# Patient Record
Sex: Female | Born: 1935 | Race: White | Hispanic: No
Health system: Southern US, Community
[De-identification: ages and names within clinical notes are randomized; demographics above are authoritative.]

---

## 2002-07-28 ENCOUNTER — Inpatient Hospital Stay (HOSPITAL_COMMUNITY): Admission: AD | Admit: 2002-07-28 | Discharge: 2002-08-01 | Payer: Self-pay | Admitting: Orthopedic Surgery

## 2002-07-28 ENCOUNTER — Encounter: Payer: Self-pay | Admitting: Orthopedic Surgery

## 2002-07-29 ENCOUNTER — Encounter: Payer: Self-pay | Admitting: Orthopedic Surgery

## 2002-08-01 ENCOUNTER — Inpatient Hospital Stay (HOSPITAL_COMMUNITY)
Admission: RE | Admit: 2002-08-01 | Discharge: 2002-08-07 | Payer: Self-pay | Admitting: Physical Medicine & Rehabilitation

## 2003-03-29 ENCOUNTER — Encounter: Payer: Self-pay | Admitting: Emergency Medicine

## 2003-03-29 ENCOUNTER — Inpatient Hospital Stay (HOSPITAL_COMMUNITY): Admission: EM | Admit: 2003-03-29 | Discharge: 2003-04-06 | Payer: Self-pay | Admitting: Emergency Medicine

## 2003-03-30 ENCOUNTER — Encounter: Payer: Self-pay | Admitting: Internal Medicine

## 2004-12-20 ENCOUNTER — Inpatient Hospital Stay (HOSPITAL_COMMUNITY): Admission: EM | Admit: 2004-12-20 | Discharge: 2004-12-26 | Payer: Self-pay | Admitting: Emergency Medicine

## 2004-12-20 ENCOUNTER — Ambulatory Visit: Payer: Self-pay | Admitting: Physical Medicine & Rehabilitation

## 2004-12-26 ENCOUNTER — Inpatient Hospital Stay
Admission: RE | Admit: 2004-12-26 | Discharge: 2005-01-02 | Payer: Self-pay | Admitting: Physical Medicine & Rehabilitation

## 2005-11-11 ENCOUNTER — Inpatient Hospital Stay (HOSPITAL_COMMUNITY): Admission: EM | Admit: 2005-11-11 | Discharge: 2005-11-15 | Payer: Self-pay | Admitting: Emergency Medicine

## 2008-07-24 ENCOUNTER — Emergency Department (HOSPITAL_COMMUNITY): Admission: EM | Admit: 2008-07-24 | Discharge: 2008-07-25 | Payer: Self-pay | Admitting: Emergency Medicine

## 2009-08-08 ENCOUNTER — Inpatient Hospital Stay (HOSPITAL_COMMUNITY): Admission: EM | Admit: 2009-08-08 | Discharge: 2009-08-12 | Payer: Self-pay | Admitting: Emergency Medicine

## 2009-09-20 ENCOUNTER — Inpatient Hospital Stay (HOSPITAL_COMMUNITY): Admission: AD | Admit: 2009-09-20 | Discharge: 2009-09-23 | Payer: Self-pay | Admitting: Geriatric Medicine

## 2010-05-18 ENCOUNTER — Inpatient Hospital Stay (HOSPITAL_COMMUNITY): Admission: EM | Admit: 2010-05-18 | Discharge: 2010-05-26 | Payer: Self-pay | Admitting: Internal Medicine

## 2010-05-18 ENCOUNTER — Ambulatory Visit: Payer: Self-pay | Admitting: Diagnostic Radiology

## 2010-05-18 ENCOUNTER — Encounter: Payer: Self-pay | Admitting: Emergency Medicine

## 2010-09-17 ENCOUNTER — Encounter: Payer: Self-pay | Admitting: Geriatric Medicine

## 2010-11-09 LAB — URINE CULTURE
Colony Count: NO GROWTH
Colony Count: >=100000

## 2010-11-09 LAB — COMPREHENSIVE METABOLIC PANEL
ALT: 36 U/L — ABNORMAL HIGH (ref 0–35)
AST: 33 U/L (ref 0–37)
AST: 39 U/L — ABNORMAL HIGH (ref 0–37)
Albumin: 3.1 g/dL — ABNORMAL LOW (ref 3.5–5.2)
Albumin: 3.1 g/dL — ABNORMAL LOW (ref 3.5–5.2)
Albumin: 3.9 g/dL (ref 3.5–5.2)
Alkaline Phosphatase: 85 U/L (ref 39–117)
Alkaline Phosphatase: 129 U/L — ABNORMAL HIGH (ref 39–117)
BUN: 19 mg/dL (ref 6–23)
CO2: 22 mEq/L (ref 19–32)
Calcium: 8.4 mg/dL (ref 8.4–10.5)
Chloride: 113 mEq/L — ABNORMAL HIGH (ref 96–112)
Chloride: 115 mEq/L — ABNORMAL HIGH (ref 96–112)
Creatinine, Ser: 0.9 mg/dL (ref 0.4–1.2)
GFR calc Af Amer: 36 mL/min — ABNORMAL LOW (ref >60)
GFR calc Af Amer: 58 mL/min — ABNORMAL LOW (ref >60)
GFR calc non Af Amer: 30 mL/min — ABNORMAL LOW (ref >60)
Potassium: 3.3 mEq/L — ABNORMAL LOW (ref 3.5–5.1)
Potassium: 3 mEq/L — ABNORMAL LOW (ref 3.5–5.1)
Sodium: 143 mEq/L (ref 135–145)
Sodium: 145 mEq/L (ref 135–145)
Total Bilirubin: 0.6 mg/dL (ref 0.3–1.2)
Total Protein: 6.5 g/dL (ref 6.0–8.3)
Total Protein: 7.4 g/dL (ref 6.0–8.3)

## 2010-11-09 LAB — BASIC METABOLIC PANEL
BUN: 6 mg/dL (ref 6–23)
BUN: 6 mg/dL (ref 6–23)
BUN: 7 mg/dL (ref 6–23)
BUN: 9 mg/dL (ref 6–23)
BUN: 9 mg/dL (ref 6–23)
CO2: 25 mEq/L (ref 19–32)
Calcium: 8.1 mg/dL — ABNORMAL LOW (ref 8.4–10.5)
Calcium: 8.5 mg/dL (ref 8.4–10.5)
Calcium: 9 mg/dL (ref 8.4–10.5)
Calcium: 9 mg/dL (ref 8.4–10.5)
Calcium: 9.3 mg/dL (ref 8.4–10.5)
Creatinine, Ser: 0.65 mg/dL (ref 0.4–1.2)
Creatinine, Ser: 0.72 mg/dL (ref 0.4–1.2)
Creatinine, Ser: 1.27 mg/dL — ABNORMAL HIGH (ref 0.4–1.2)
GFR calc Af Amer: >60 mL/min (ref >60)
GFR calc non Af Amer: >60 mL/min (ref >60)
GFR calc non Af Amer: >60 mL/min (ref >60)
GFR calc non Af Amer: >60 mL/min (ref >60)
GFR calc non Af Amer: >60 mL/min (ref >60)
GFR calc non Af Amer: >60 mL/min (ref >60)
Glucose, Bld: 117 mg/dL — ABNORMAL HIGH (ref 70–99)
Glucose, Bld: 117 mg/dL — ABNORMAL HIGH (ref 70–99)
Glucose, Bld: 151 mg/dL — ABNORMAL HIGH (ref 70–99)
Glucose, Bld: 95 mg/dL (ref 70–99)
Glucose, Bld: 98 mg/dL (ref 70–99)
Potassium: 2.7 mEq/L — CL (ref 3.5–5.1)
Potassium: 3 mEq/L — ABNORMAL LOW (ref 3.5–5.1)
Potassium: 3.4 mEq/L — ABNORMAL LOW (ref 3.5–5.1)

## 2010-11-09 LAB — CARDIAC PANEL(CRET KIN+CKTOT+MB+TROPI)
CK, MB: 3 ng/mL (ref 0.3–4.0)
Relative Index: INVALID (ref 0.0–2.5)
Relative Index: INVALID (ref 0.0–2.5)
Relative Index: INVALID (ref 0.0–2.5)
Total CK: 43 U/L (ref 7–177)
Troponin I: 0.33 ng/mL — ABNORMAL HIGH (ref 0.00–0.06)

## 2010-11-09 LAB — POCT CARDIAC MARKERS
CKMB, poc: <1 ng/mL — ABNORMAL LOW (ref 1.0–8.0)
Myoglobin, poc: 57.9 ng/mL (ref 12–200)
Troponin i, poc: <0.05 ng/mL (ref 0.00–0.09)

## 2010-11-09 LAB — CBC
HCT: 32.8 % — ABNORMAL LOW (ref 36.0–46.0)
HCT: 34.5 % — ABNORMAL LOW (ref 36.0–46.0)
HCT: 36.4 % (ref 36.0–46.0)
Hemoglobin: 11.1 g/dL — ABNORMAL LOW (ref 12.0–15.0)
Hemoglobin: 11.1 g/dL — ABNORMAL LOW (ref 12.0–15.0)
Hemoglobin: 11.2 g/dL — ABNORMAL LOW (ref 12.0–15.0)
MCH: 29.4 pg (ref 26.0–34.0)
MCHC: 33.7 g/dL (ref 30.0–36.0)
MCHC: 33.8 g/dL (ref 30.0–36.0)
MCHC: 33.9 g/dL (ref 30.0–36.0)
MCHC: 34 g/dL (ref 30.0–36.0)
MCHC: 34.2 g/dL (ref 30.0–36.0)
MCHC: 34.2 g/dL (ref 30.0–36.0)
MCV: 87.1 fL (ref 78.0–100.0)
MCV: 87.3 fL (ref 78.0–100.0)
MCV: 87.5 fL (ref 78.0–100.0)
MCV: 86.1 fL (ref 78.0–100.0)
Platelets: 225 10*3/uL (ref 150–400)
Platelets: 264 10*3/uL (ref 150–400)
Platelets: 274 10*3/uL (ref 150–400)
Platelets: 285 10*3/uL (ref 150–400)
Platelets: 304 10*3/uL (ref 150–400)
Platelets: 328 10*3/uL (ref 150–400)
Platelets: 289 10*3/uL (ref 150–400)
RBC: 3.75 MIL/uL — ABNORMAL LOW (ref 3.87–5.11)
RBC: 3.78 MIL/uL — ABNORMAL LOW (ref 3.87–5.11)
RBC: 3.98 MIL/uL (ref 3.87–5.11)
RBC: 4.05 MIL/uL (ref 3.87–5.11)
RDW: 14.7 % (ref 11.5–15.5)
RDW: 14.9 % (ref 11.5–15.5)
RDW: 15 % (ref 11.5–15.5)
RDW: 15.2 % (ref 11.5–15.5)
RDW: 15.2 % (ref 11.5–15.5)
RDW: 15.3 % (ref 11.5–15.5)
RDW: 15.5 % (ref 11.5–15.5)
RDW: 14.2 % (ref 11.5–15.5)
WBC: 15.3 10*3/uL — ABNORMAL HIGH (ref 4.0–10.5)
WBC: 15.9 10*3/uL — ABNORMAL HIGH (ref 4.0–10.5)
WBC: 32.1 10*3/uL — ABNORMAL HIGH (ref 4.0–10.5)
WBC: 29.4 10*3/uL — ABNORMAL HIGH (ref 4.0–10.5)

## 2010-11-09 LAB — CULTURE, BLOOD (ROUTINE X 2)
Culture  Setup Time: 201109230219
Culture  Setup Time: 201109230220
Culture  Setup Time: 201109251438
Culture: NO GROWTH
Culture: NO GROWTH
Culture: NO GROWTH
Culture: NO GROWTH

## 2010-11-09 LAB — URINALYSIS, MICROSCOPIC ONLY
Bilirubin Urine: NEGATIVE
Nitrite: NEGATIVE
Specific Gravity, Urine: 1.017 (ref 1.005–1.030)
Urobilinogen, UA: 0.2 mg/dL (ref 0.0–1.0)
pH: 6 (ref 5.0–8.0)

## 2010-11-09 LAB — URINALYSIS, ROUTINE W REFLEX MICROSCOPIC
Nitrite: POSITIVE — AB
Protein, ur: >300 mg/dL — AB
Urobilinogen, UA: 1 mg/dL (ref 0.0–1.0)

## 2010-11-09 LAB — POTASSIUM: Potassium: 4.1 mEq/L (ref 3.5–5.1)

## 2010-11-09 LAB — DIFFERENTIAL
Basophils Absolute: 0 10*3/uL (ref 0.0–0.1)
Basophils Relative: 0 % (ref 0–1)
Basophils Relative: 0 % (ref 0–1)
Eosinophils Relative: 4 % (ref 0–5)
Eosinophils Relative: 1 % (ref 0–5)
Lymphocytes Relative: 6 % — ABNORMAL LOW (ref 12–46)
Monocytes Absolute: 1.6 10*3/uL — ABNORMAL HIGH (ref 0.1–1.0)
Monocytes Relative: 3 % (ref 3–12)
Neutro Abs: 8.8 10*3/uL — ABNORMAL HIGH (ref 1.7–7.7)
Neutro Abs: 26.4 10*3/uL — ABNORMAL HIGH (ref 1.7–7.7)

## 2010-11-09 LAB — MAGNESIUM
Magnesium: 1.6 mg/dL (ref 1.5–2.5)
Magnesium: 1.6 mg/dL (ref 1.5–2.5)
Magnesium: 2 mg/dL (ref 1.5–2.5)

## 2010-11-09 LAB — TROPONIN I
Troponin I: 0.4 ng/mL — ABNORMAL HIGH (ref 0.00–0.06)
Troponin I: 0.42 ng/mL — ABNORMAL HIGH (ref 0.00–0.06)

## 2010-11-09 LAB — MRSA PCR SCREENING: MRSA by PCR: NEGATIVE

## 2010-11-09 LAB — URINE MICROSCOPIC-ADD ON

## 2010-11-09 LAB — CLOSTRIDIUM DIFFICILE BY PCR: Toxigenic C. Difficile by PCR: NOT DETECTED

## 2010-11-12 LAB — BASIC METABOLIC PANEL
BUN: 17 mg/dL (ref 6–23)
BUN: 26 mg/dL — ABNORMAL HIGH (ref 6–23)
BUN: 37 mg/dL — ABNORMAL HIGH (ref 6–23)
CO2: 26 mEq/L (ref 19–32)
Chloride: 102 mEq/L (ref 96–112)
Chloride: 99 mEq/L (ref 96–112)
Creatinine, Ser: 1.53 mg/dL — ABNORMAL HIGH (ref 0.4–1.2)
GFR calc Af Amer: 38 mL/min — ABNORMAL LOW (ref >60)
GFR calc non Af Amer: 42 mL/min — ABNORMAL LOW (ref >60)
Glucose, Bld: 101 mg/dL — ABNORMAL HIGH (ref 70–99)
Glucose, Bld: 105 mg/dL — ABNORMAL HIGH (ref 70–99)
Potassium: 2.8 mEq/L — ABNORMAL LOW (ref 3.5–5.1)
Potassium: 3.2 mEq/L — ABNORMAL LOW (ref 3.5–5.1)
Potassium: 3.8 mEq/L (ref 3.5–5.1)
Sodium: 134 mEq/L — ABNORMAL LOW (ref 135–145)
Sodium: 137 mEq/L (ref 135–145)

## 2010-11-12 LAB — COMPREHENSIVE METABOLIC PANEL
AST: 22 U/L (ref 0–37)
Albumin: 3.1 g/dL — ABNORMAL LOW (ref 3.5–5.2)
BUN: 50 mg/dL — ABNORMAL HIGH (ref 6–23)
Calcium: 8.6 mg/dL (ref 8.4–10.5)
Chloride: 97 mEq/L (ref 96–112)
Creatinine, Ser: 3.02 mg/dL — ABNORMAL HIGH (ref 0.4–1.2)
GFR calc Af Amer: 17 mL/min — ABNORMAL LOW (ref >60)
Total Protein: 5.7 g/dL — ABNORMAL LOW (ref 6.0–8.3)

## 2010-11-12 LAB — URINALYSIS, MICROSCOPIC ONLY
Nitrite: NEGATIVE
Protein, ur: NEGATIVE mg/dL
Specific Gravity, Urine: 1.014 (ref 1.005–1.030)
Urobilinogen, UA: 0.2 mg/dL (ref 0.0–1.0)

## 2010-11-12 LAB — AMYLASE: Amylase: 130 U/L — ABNORMAL HIGH (ref 0–105)

## 2010-11-12 LAB — DIFFERENTIAL
Basophils Absolute: 0.1 10*3/uL (ref 0.0–0.1)
Basophils Relative: 0 % (ref 0–1)
Eosinophils Relative: 1 % (ref 0–5)
Lymphocytes Relative: 18 % (ref 12–46)
Neutro Abs: 9.5 10*3/uL — ABNORMAL HIGH (ref 1.7–7.7)

## 2010-11-12 LAB — CBC
HCT: 35.6 % — ABNORMAL LOW (ref 36.0–46.0)
HCT: 36.2 % (ref 36.0–46.0)
Hemoglobin: 11.8 g/dL — ABNORMAL LOW (ref 12.0–15.0)
MCHC: 33.2 g/dL (ref 30.0–36.0)
MCHC: 34.4 g/dL (ref 30.0–36.0)
MCV: 87.1 fL (ref 78.0–100.0)
Platelets: 243 10*3/uL (ref 150–400)
Platelets: 301 10*3/uL (ref 150–400)
RDW: 13.9 % (ref 11.5–15.5)
RDW: 13.9 % (ref 11.5–15.5)
RDW: 14.1 % (ref 11.5–15.5)
WBC: 11 10*3/uL — ABNORMAL HIGH (ref 4.0–10.5)

## 2010-11-12 LAB — LIPASE, BLOOD: Lipase: 38 U/L (ref 11–59)

## 2010-11-12 LAB — HEMOGLOBIN AND HEMATOCRIT, BLOOD: Hemoglobin: 13.1 g/dL (ref 12.0–15.0)

## 2010-11-12 LAB — CARDIAC PANEL(CRET KIN+CKTOT+MB+TROPI)
CK, MB: 1.2 ng/mL (ref 0.3–4.0)
Total CK: 19 U/L (ref 7–177)

## 2010-11-12 LAB — URINE CULTURE: Culture: NO GROWTH

## 2010-11-28 LAB — DIFFERENTIAL
Basophils Absolute: 0.2 10*3/uL — ABNORMAL HIGH (ref 0.0–0.1)
Eosinophils Absolute: 0.3 10*3/uL (ref 0.0–0.7)
Lymphocytes Relative: 11 % — ABNORMAL LOW (ref 12–46)
Lymphocytes Relative: 17 % (ref 12–46)
Lymphs Abs: 1.5 10*3/uL (ref 0.7–4.0)
Lymphs Abs: 1.9 10*3/uL (ref 0.7–4.0)
Lymphs Abs: 2.1 10*3/uL (ref 0.7–4.0)
Monocytes Absolute: 1 10*3/uL (ref 0.1–1.0)
Monocytes Relative: 10 % (ref 3–12)
Monocytes Relative: 8 % (ref 3–12)
Neutro Abs: 6.3 10*3/uL (ref 1.7–7.7)
Neutro Abs: 8 10*3/uL — ABNORMAL HIGH (ref 1.7–7.7)
Neutrophils Relative %: 63 % (ref 43–77)
Neutrophils Relative %: 72 % (ref 43–77)
Neutrophils Relative %: 81 % — ABNORMAL HIGH (ref 43–77)

## 2010-11-28 LAB — BASIC METABOLIC PANEL
BUN: 15 mg/dL (ref 6–23)
BUN: 25 mg/dL — ABNORMAL HIGH (ref 6–23)
CO2: 28 mEq/L (ref 19–32)
Calcium: 8.2 mg/dL — ABNORMAL LOW (ref 8.4–10.5)
Calcium: 8.8 mg/dL (ref 8.4–10.5)
Calcium: 9.3 mg/dL (ref 8.4–10.5)
Chloride: 102 mEq/L (ref 96–112)
Chloride: 106 mEq/L (ref 96–112)
Creatinine, Ser: 0.91 mg/dL (ref 0.4–1.2)
Creatinine, Ser: 0.98 mg/dL (ref 0.4–1.2)
Creatinine, Ser: 1.19 mg/dL (ref 0.4–1.2)
GFR calc Af Amer: >60 mL/min (ref >60)
GFR calc Af Amer: >60 mL/min (ref >60)
GFR calc non Af Amer: 42 mL/min — ABNORMAL LOW (ref >60)
GFR calc non Af Amer: 57 mL/min — ABNORMAL LOW (ref >60)
Potassium: 3.5 mEq/L (ref 3.5–5.1)
Sodium: 141 mEq/L (ref 135–145)

## 2010-11-28 LAB — URINE CULTURE
Colony Count: NO GROWTH
Culture: NO GROWTH

## 2010-11-28 LAB — CBC
Hemoglobin: 11.4 g/dL — ABNORMAL LOW (ref 12.0–15.0)
MCHC: 33.4 g/dL (ref 30.0–36.0)
Platelets: 258 10*3/uL (ref 150–400)
Platelets: 295 10*3/uL (ref 150–400)
RBC: 3.82 MIL/uL — ABNORMAL LOW (ref 3.87–5.11)
RBC: 3.89 MIL/uL (ref 3.87–5.11)
WBC: 11.1 10*3/uL — ABNORMAL HIGH (ref 4.0–10.5)
WBC: 13.7 10*3/uL — ABNORMAL HIGH (ref 4.0–10.5)
WBC: 9.9 10*3/uL (ref 4.0–10.5)

## 2010-11-28 LAB — URINE MICROSCOPIC-ADD ON

## 2010-11-28 LAB — URINALYSIS, ROUTINE W REFLEX MICROSCOPIC
Glucose, UA: NEGATIVE mg/dL
Leukocytes, UA: NEGATIVE
Nitrite: NEGATIVE
Protein, ur: 30 mg/dL — AB
pH: 6 (ref 5.0–8.0)

## 2010-11-28 LAB — RETICULOCYTES: Retic Count, Absolute: 49.8 10*3/uL (ref 19.0–186.0)

## 2010-11-28 LAB — IRON AND TIBC
Saturation Ratios: 7 % — ABNORMAL LOW (ref 20–55)
TIBC: 228 ug/dL — ABNORMAL LOW (ref 250–470)

## 2010-11-28 LAB — VITAMIN B12: Vitamin B-12: 422 pg/mL (ref 211–911)

## 2010-11-28 LAB — FOLATE: Folate: 16.2 ng/mL

## 2010-11-28 LAB — HEMOCCULT GUIAC POC 1CARD (OFFICE): Fecal Occult Bld: NEGATIVE

## 2010-11-28 LAB — MAGNESIUM: Magnesium: 1.8 mg/dL (ref 1.5–2.5)

## 2010-11-28 LAB — PROTIME-INR
INR: 1.63 — ABNORMAL HIGH (ref 0.00–1.49)
Prothrombin Time: 19.2 seconds — ABNORMAL HIGH (ref 11.6–15.2)

## 2011-01-12 NOTE — Discharge Summary (Signed)
NAME:  [name], [name]              ACCOUNT NO.:  [id number]   MEDICAL RECORD NO.:  [id number]          PATIENT TYPE:  INP   LOCATION:  5526                         FACILITY:  [facility]   PHYSICIAN:  Deirdre Peer. Polite, M.D. DATE OF BIRTH:  10-Feb-1908   DATE OF ADMISSION:  11/10/2005  DATE OF DISCHARGE:                                 DISCHARGE SUMMARY   DISCHARGE DIAGNOSES:  1.  Urinary tract infection, culture positive for Klebsiella pneumoniae. To      complete 3 more days of antibiotics.  2.  Renal insufficiency, resolved, more than likely secondary to diuretic      plus previous diarrheal illness.  3.  Chronic atrial fibrillation. Please note INR elevated on admission at      6.6. Coumadin held. INR at discharge 1.4. Will allow to slowly rise into      normal range. Further outpatient followup by primary physician.  4.  Elevated INR, resolved.  5.  Mild compression fracture at L1 per family. No invasive evaluation at      this time. The patient and family are aware and cleared for further      outpatient followup as needed.   DISCHARGE MEDICATIONS:  1.  Cipro 250 one pill b.i.d. for 3 more days.  2.  Cardizem 40 one daily.  3.  Coumadin 1 mg daily.  4.  The patient was asked to hold Lasix until seen by primary M.D.  5.  P.r.n. Tylenol for low back pain.   DISPOSITION:  The patient will be discharged to an assisted living facility.   STUDIES:  Urine culture:  Klebsiella pneumoniae. INR at discharge 1.4. Blood  cultures negative. Stool for C. difficile negative. Creatinine at discharge  1.3. CBC March 20:  White count 13.7 down from 17.2; otherwise, within  normal limits. CT of the head:  Atrophy with chronic microvascular  __________.  No definite stroke or hemorrhage. X-ray of the pelvis:  Status  post __________ hip surgery. __________. X-ray of the L spine:  Mild L1  compression fracture __________.   HISTORY OF PRESENT ILLNESS:  __________ azotemia, as well as  leukocytosis.  In the ED the patient was evaluated, felt to have a UTI and was also found  to have Coumadin mild toxicity. Admission was deemed necessary for further  evaluation and treatment. Please see dictated H&P for further details.   PAST MEDICAL HISTORY:  Per admission H&P.   FAMILY AND SOCIAL HISTORY:  Per admission H&P.   MEDICATIONS:  Per admission H&P.   HOSPITAL COURSE:  The patient was admitted to a telemetry floor bed for  evaluation of __________. Ultimately, it was determined that the patient's  leukocytosis was secondary to a UTI. Urine culture did grow out Klebsiella  pneumoniae sensitive to quinolone. The patient is to continue 3 more days of  antibiotics. As for the patient's azotemia, it is multifactorial secondary  to __________ with diuretics as well as a previous diarrheal illness. Please  note the patient's creatinine at discharge was 1.3.  Please note also that  the patient had problems with low back pain  during this admission. Had an x-  ray that showed a mild compression fracture which was fairly well controlled  with Tylenol. The patient is very resistant to taking narcotic medication.  The case was discussed with her and her son in detail and will continue  conservative treatment at this time. As for the patient's Coumadin toxicity,  probably secondary to acute illness. Recommend increased frequency and  monitoring of INR. Please note INR at discharge is 1.4. We will allow the  INR to slowly drift into a normal range. Recommend further followup by  primary M.D. The patient has been evaluated by PT/OT. Discharge plan has  been discussed with the family in detail, and per the family to be  discharged back to an assisted living facility. It was discussed with the  family in detail and all arrangements have been arranged. The patient is  ready for discharge today.      Deirdre Peer. Polite, M.D.  Electronically Signed     RDP/MEDQ  D:  11/15/2005  T:   11/15/2005  Job:  [id number]   cc:   Hal T. Stoneking, M.D.  Fax: [phone]

## 2011-01-12 NOTE — H&P (Signed)
NAME:  [name], [name]                          ACCOUNT NO.:  [id number]   MEDICAL RECORD NO.:  [id number]                   PATIENT TYPE:   LOCATION:                                       FACILITY:   PHYSICIAN:  [name], M.D.                 DATE OF BIRTH:  Jan 22, 1908   DATE OF ADMISSION:  07/28/2002  DATE OF DISCHARGE:                                HISTORY & PHYSICAL   CHIEF COMPLAINT:  Right hip pain.   HISTORY OF PRESENT ILLNESS:  The patient is a 77 year old white female who  reported to have fallen at home yesterday, landing on her right side.  She  complains of pain in the right hip and difficulty with ambulation.  She went  to her primary care physician, [name], M.D., where x-rays were  taken and she was found to have a nondisplaced subcapital femoral neck  fracture.  The patient was referred to our office for further evaluation.  [name], M.D., feels that this needs surgical pinning.  We will admit  her for this procedure once she is medically cleared and her Coumadin is  normalized.   ALLERGIES:  No known drug allergies.   CURRENT MEDICATIONS:  1. Coumadin 2 mg p.o. q.d.  2. Antivert 12.5 mg p.o. q.a.m. and q.h.s.  3. Hydrochlorothiazide 25 mg p.o. at noon.  4. Captopril 25 mg p.o. b.i.d.  5. Chlorazepate 3.25 mg p.o. p.r.n.  6. Tiazac 120 mg p.o. q.d.   PAST MEDICAL HISTORY:  1. Labyrinthitis.  2. Hypertension.   PAST SURGICAL HISTORY:  Cholecystectomy in 1970.   SOCIAL HISTORY:  The patient is a widow.  She does not smoke.  She does not  drink.   FAMILY HISTORY:  She does have four children.  Her daughter has problems  with CHF.  A son has a history of polio.  A second son has hypertension and  diabetes.  A third son has hypertension and diabetes.  I believe that her  son is a Orthoptist at [employer]. [employer]  Her father is  deceased from age-related issues at 19.  Her mother is deceased at 33.  She  had a stroke.  One  brother deceased at 15 and had Parkinson's.  One sister  deceased at 61 with pancreatic disease.  One sister is 105 with some heart  disease.  A sister in her 35s with Parkinson's.  Another sister with  hypertension and heart disease.  One brother in fairly good health.  One  brother with hypertension.  Another sister with hypertension.  One last  brother with hypertension.   REVIEW OF SYMPTOMS:  Noncontributory at this time.   PHYSICAL EXAMINATION:  GENERAL APPEARANCE:  This is an age-appropriate,  healthy-appearing, white female.  She is conscious, alert, and appropriate.  She is in a wheelchair.  She has difficulty with pain in her  right hip with  any type of ambulation.  HEENT:  Normocephalic and atraumatic.  Pupils equal, round, reactive, and  accommodating to light.  Extraocular movements intact.  The sclerae are not  icteric.  The conjunctivae are pink and moist.  External ears without  deformities.  Hearing is intact.  The oral buccal mucosa was intact.  The  patient was able to swallow without any problems.  NECK:  Supple.  No tenderness around the thyroid region.  She had fairly  good range of motion of her cervical spine.  No lymphadenopathy.  No carotid  bruits.  CHEST:  Clear and equal bilaterally.  HEART:  Irregularly irregular rate and rhythm of S1 and S2.  No murmurs,  rubs, or gallops noted.  ABDOMEN:  Soft and nontender.  Bowel sounds were normoactive throughout.  No  mass was palpable.  The CVA area was nontender.  EXTREMITIES:  The upper extremities were normal appearing.  In the lower  extremities, the patient had some discomfort with her right hip with any  type of range of motion.  The left hip had normal range of motion without  any difficulty.  The knees were symmetrical with no signs of infection or  effusions.  They were round and boggy appearing.  They had good range of  motion.  The ankles were symmetrical with good dorsiflexion and plantar  flexion.   PERIPHERAL VASCULATURE:  Carotid pulses with no bruits.  Radial pulses were  symmetrical and irregular.  Trace dorsalis pedis pulses.  The patient did  have 2+ pitting edema, but no venous stasis changes or pigmentation changes.  NEUROLOGIC:  The patient was conscious, alert, and appropriate.  Easy  conversation with the examiner.  She is grossly intact to light touch  sensation from head to toe.  There were no focal neurologic changes.  BREASTS:  Deferred.  RECTAL:  Deferred.  GENITOURINARY:  Deferred.   IMPRESSION:  1. Right hip subcapital femoral neck fracture, nondisplaced.  2. Hypertension.  3. Labyrinthitis.  4. Chronic atrial fibrillation on Coumadin.   PLAN:  The patient will be admitted to [facility] under  the care of [name], M.D.  The patient will have a stat PT and INR for  evaluation of her coagulation times and normalization as [name],  M.D., feels it is quite safe to normalize it with vitamin K.  All other labs  and tests will be performed prior to having an angulated pinning of her  right hip fracture.  The patient will also be evaluated by Dr. Pete Le in  the hospital this evening.     [name], [name], M.D.    RWK/MEDQ  D:  07/28/2002  T:  07/28/2002  Job:  [id number]

## 2011-01-12 NOTE — Discharge Summary (Signed)
NAME:  ALOHA, [name]              ACCOUNT NO.:  [id number]   MEDICAL RECORD NO.:  [id number]          PATIENT TYPE:  INP   LOCATION:  5024                         FACILITY:  [facility]   PHYSICIAN:  Jackie Plum, M.D.DATE OF BIRTH:  04/10/1908   DATE OF ADMISSION:  12/20/2004  DATE OF DISCHARGE:  12/25/2004                                 DISCHARGE SUMMARY   DISCHARGE DIAGNOSES:  1.  Thoracic compression fracture.  2.  History of chronic atrial fibrillation on chronic anticoagulation.  3.  History of hypertension.  4.  Benign positional vertigo.  5.  Osteoporosis and degenerative joint disease, status post right hip      replacement in December 2003; repair of left hip fracture in 2004.   MEDICATIONS:  1.  Lasix 40 mg alternating with Lasix 80 mg every other day.  2.  Coumadin 2 mg alternating with 1.5 mg every third day.  3.  Diovan 80 mg daily.  4.  Tiazac 240 mg daily.  5.  Calcium carbonate D one b.i.d.  6.  Ultracet two tablets two p.o. q.6h. p.r.n.   DISCHARGE LABORATORY:  Sodium 142, potassium 3.9, chloride 102, CO2 39,  glucose 89, BUN 15, creatinine 1.1, calcium 8.8.   PROCEDURE:  Not applicable.   CONSULTATIONS:  Dr. Eulah Pont of orthopedic surgery.   CONDITION ON DISCHARGE:  Improved and satisfactory.   The patient presented to her PCP, Dr. Merlene Laughter, and subsequently  admitted no account of acute back pain. She had slipped and fell fracturing  her buttocks on December 03, 2004, and since then had been experiencing  progressively worsening low back pain. Plain films were ordered in the  office which revealed T12, L1, L4 compression fracture, according to the Dr.  [name] report. She was subsequently set up for an MRI on the day of  admission, however in view of worsening pain she was subsequently admitted  for further management. She was not able to ambulate at the time.  According  to the H&P by Dr. Pete Glatter on admission, the patient's neurologic testing  was nonfocal. She had an irregularly irregular rhythm of 106, systolic  murmur. Her lungs were clear to auscultation.   HOSPITAL COURSE:  The patient was admitted to the hospitalist service.  We  offered supportive care including analgesics. MRI was obtained which has  reported on December 20, 2004, revealing acute compression of T11.  The  patient's pain control was very effective and her pain was appropriately  alleviated. She was seen in consultation by Dr. Eulah Pont regarding the  possibility of vertebroplasty, who in view of the fact that her pain has  been well controlled the vertebroplasty would not be necessary at this point  and recommended orthotic brace which provided good effect, since December 22, 2004.  The patient had been seen by physical therapy who recommended  possible  SACUst ay for improved energy/endurance and optimization of the  ability to perform activities of daily living prior to discharge. This  dictation is therefore being done in anticipation of discharge possibility  the next day or two to subacute versus skilled  level.  Otherwise today the  patient feels fine, she does not have any complaints. Her pain is well  controlled. She is not dizzy. She denies any fever or chills. She is alert  and appropriate. Her vital signs done at 6:00 this morning indicate a BP of  19/51, respirations 20, temperature 97.2, pulse 68, O2 saturation 96% on  room air. She is not in any distress. Her lungs were clear to auscultation.  Cardiac had no gallops, irregular, not tachycardia. Abdomen was soft,  nontender. Neurologic testing nonfocal. I have requested SACU evaluation  today for possible discharge from the acute hospital tomorrow, Monday, Dec 25, 2004, for further restorative treatment.      GO/MEDQ  D:  12/24/2004  T:  12/24/2004  Job:  [id number]   cc:   Hal T. Stoneking, M.D.  [address]  [place], [place] [address]  Fax: [phone]   Loreta Ave, M.D.  [address]  [place] [address]  Fax: [phone]

## 2011-01-12 NOTE — H&P (Signed)
NAME:  Way, Mirissa              ACCOUNT NO.:  [id number]   MEDICAL RECORD NO.:  [id number]          PATIENT TYPE:  EMS   LOCATION:  MAJO                         FACILITY:  [facility]   PHYSICIAN:  Hal T. Stoneking, M.D. DATE OF BIRTH:  [birth year]   DATE OF ADMISSION:  12/20/2004  DATE OF DISCHARGE:                                HISTORY & PHYSICAL   IDENTIFYING DATA:  Mrs. Fahs is a delightful 77 year old white female who  has been living independently at [facility]. She has a prior history of  osteoporosis, had been on calcium with vitamin D b.i.d., but had stopped her  Actonel about a year ago. Mrs. Carone slipped and fell, sliding down and  striking her buttocks back on December 03, 2004. Since that time she has had  increasing low-back pain, sometimes in the right low back, sometimes in the  left, sometimes down into the hip. She was seen in the office yesterday.  Plain films done revealed a T12, L1, and L4 compression fractures. She was  set up for an MRI today but this morning had increasing pain in her left leg  and was unable to ambulate, and came to the emergency room.   PAST MEDICAL HISTORY:  No known drug allergies.   Current medications:  1.  Lasix 40 mg alternating with 80 every-other day.  2.  Coumadin 2 mg alternating with one-and-a-half mg every third day.  3.  Diovan 80 mg a day.  4.  Tiazac 240 mg a day.  5.  Calcium carbonate with D b.i.d.   Past medical history remarkable for:  1.  Chronic atrial fibrillation.  2.  Hypertension.  3.  Benign positional vertigo.  4.  Osteoporosis.  5.  DJD.   Previous surgery:  1.  Status post right hip replacement in December 2003.  2.  Repair of a left hip fracture in August 2004.   SOCIAL HISTORY:  She is widowed, she lives at [employer]. Her sons, Lollie Sails  and Nylani Michetti, look after her very closely. She does not smoke, does not  consume alcohol.   FAMILY HISTORY:  Unremarkable.   REVIEW OF SYSTEMS:  No headache, no change  in her vision, no chest pain, no  shortness of breath, no abdominal pain. Does complain of chronic lower  extremity edema which seems to be getting a little worse.   PHYSICAL EXAMINATION:  VITAL SIGNS:  Temperature 97.1, pulse 90, respiratory  rate 18, blood pressure 137/53.  SKIN:  Does reveal some erythema in her ankles consistent with venous stasis  dermatitis.  HEENT:  Unremarkable.  LUNGS:  Clear.  HEART:  Irregular rhythm with a 1/6 systolic murmur.  ABDOMEN:  Soft, no hepatosplenomegaly or masses palpated.  NEUROLOGIC:  Nonfocal. She is alert and oriented x3.  EXTREMITIES:  Revealed 3+ edema with erythema in her ankles.   LABORATORY DATA:  UA remarkable for 3-6 wbc's, hemoglobin 12.6. Potassium is  decreased to 3.3, sodium 137, chloride 105, glucose 103, creatinine 1.2. X-  ray of the left hip reported as normal.   ASSESSMENT:  1.  Multiple vertebral compression fractures  with persistent and actually      worsening pain. Will try p.o. Ultracet. Proceed with her MRI of the      thoracic and lumbar spine. Will hold her Coumadin with the possibility      that she might be a candidate for vertebroplasty.  2.  Venous insufficiency with dermatitis. Will increase her Lasix. Also use      Aristocort cream and try knee-high TED stockings.  3.  Atrial fibrillation, rate controlled. Again, will have to hold her      Coumadin.  4.  Physical therapy consult to assist with gait, especially after she gets      a definitive treatment of her vertebral compression fractures. Would      consider restarting her bisphosphonate. Could talk to her about this as      an outpatient.  5.  Urinary tract infection. Will treat with Cipro.  6.  Hypokalemia. Will replace her potassium, recheck a BMET tomorrow. Will      temporarily place a Foley catheter as it is hard for her to get up and      down and she is incontinent of urine.      HTS/MEDQ  D:  12/20/2004  T:  12/20/2004  Job:  [id number]

## 2011-01-12 NOTE — Discharge Summary (Signed)
NAMENANNETTE, ZILL                          ACCOUNT NO.:  [id number]   MEDICAL RECORD NO.:  [id number]                   PATIENT TYPE:  IPS   LOCATION:  4153                                 FACILITY:  [facility]   PHYSICIAN:  Ranelle Oyster, M.D.             DATE OF BIRTH:  May 07, 1908   DATE OF ADMISSION:  08/01/2002  DATE OF DISCHARGE:  08/07/2002                                 DISCHARGE SUMMARY   DISCHARGE DIAGNOSES:  1. Right hip fracture, open reduction internal fixation.  2. Paroxysmal atrial fibrillation.  3. Hyponatremia, resolving.   HISTORY OF PRESENT ILLNESS:  The patient is a 77 year old female with a  history of hypertension, atrial fibrillation, labyrinthitis, admitted on  07/28/02, status post fall at home sustaining a nondisplaced right subcapital  femoral neck fracture.  She was evaluated by Dr. [name] and Coumadin  reversed, and once the patient was cleared for surgery she underwent right  hip pinning on 07/29/02.  Postoperatively, is positive weightbearing and  Coumadin resumed.  Persistent problems with mild hyponatremia with sodium at  129 on admission.  Hydrochlorothiazide was discontinued with sodium  improving to 132.  Physical therapy was initiated, and the patient is  minimal assist for transfers, minimal assist for pivot transfers to chair.   PAST MEDICAL HISTORY:  1. Hypertension.  2. Labyrinthitis with disequilibrium.  3. Cholecystectomy.  4. Right shoulder fracture.  5. Chronic atrial fibrillation.  6. Hemorrhoidectomy.  7. Frequency and urgency with nocturia.  No incontinence.  8. Renal calculi.   ALLERGIES:  No known drug allergies.   SOCIAL HISTORY:  The patient lives with son in one level home with three  steps at entry.  Was independent prior to admission.  She does not use any  tobacco or alcohol.   HOSPITAL COURSE:  The patient was admitted to rehabilitation on 08/01/02, for  inpatient therapies to consist of physical therapy and  occupational therapy  daily.  Past admission, she was maintained on Coumadin for chronic atrial  fibrillation, as well as deep venous thrombosis prophylaxis.  Blood  pressures were monitored off hydrochlorothiazide, and showed borderline  control.  She was kept off hydrochlorothiazide, and labs past admission  showed a sodium 133, potassium 4.4, chloride 99, CO2 of 27, BUN 18,  creatinine 0.8, glucose 102.  Hemoglobin 12.1, hematocrit 35, white blood  cell count 12.7, platelets 348.  An UAUCS done past admission showed no  growth.  The patient's right hip incision was noted to be healing well  without any signs or symptoms of infection.  No drainage, no erythema noted.  Heart rate was controlled throughout her stay.  The patient's INR was slow  to rise secondary to effects of vitamin K.  PT was 15.1, INR 1.2 at time of  discharge.  She was discharged on 4 mg of Coumadin a day with next  prothrombin time to be drawn on Monday, 08/10/02, with  results to Dr.  Pete Glatter.  During her stay in rehabilitation, the patient progressed to  being independent for bed mobility.  She was supervision to modified  independent for transfers, supervision for ambulating 100 feet.  She  required supervision for activities of daily living needs.  Further followup  therapies were set up via [facility] to include physical therapy and  occupational therapy past discharge.  On 08/07/02, the patient is discharged  to home.   DISCHARGE MEDICATIONS:  1. Coumadin 4 mg daily.  2. Capoten 25 mg b.i.d.  3. Cardizem 120 mg daily.  4. Meclizine 12.5 mg q.h.s.  5. Tranxene 3.75 mg b.i.d. p.r.n.  6. OxyContin IR 5-10 mg p.o. q.4-6h. p.r.n. pain.   ACTIVITY:  Positive weight on the right lower extremity, use walker.   WOUND CARE:  Keep area clean and dry.   DISCHARGE INSTRUCTIONS:  No alcohol, no smoking, no driving.  Home health to  draw prothrombin time on 08/10/02, with results to Dr. Pete Glatter.    FOLLOWUP:  1. The patient is to follow up with Dr. Pete Glatter and Dr. [name] in two to     three weeks.  2. Followup with Dr. Riley Kill as needed.     Greg Cutter, P.A.                    Ranelle Oyster, M.D.    PP/MEDQ  D:  09/08/2002  T:  09/09/2002  Job:  [id number]   cc:   Hal T. Stoneking, M.D.  [address]  [place], [place] [address]  Fax: [phone]   Thera Flake., M.D.  [address] [place]  [place] [address]  Fax: [phone]

## 2011-01-12 NOTE — H&P (Signed)
NAME:  [name], [name]              ACCOUNT NO.:  [id number]   MEDICAL RECORD NO.:  [id number]          PATIENT TYPE:  ORB   LOCATION:  4526                         FACILITY:  [facility]   PHYSICIAN:  Erick Colace, M.D.DATE OF BIRTH:  [birth year]   DATE OF ADMISSION:  12/26/2004  DATE OF DISCHARGE:                                HISTORY & PHYSICAL   MEDICAL RECORD NUMBER:  [id number]   DATE:  Dec 26, 2004   REASON FOR ADMISSION:  Decreased self care and mobility secondary to  vertebral compression fracture.   HISTORY:  A 77 year old female resident of [facility] who has a history of prior right hemiarthroplasty with right total  hip replacement January 2004 following a fracture.  Admitted again on December 20, 2004, with recent fall and increased mid thoracic back pain.  MRI  demonstrated T12, L1 and L4 compression fractures.  Orthopedic surgery, Dr.  Eulah Pont, followed up, and also subacute fracture and was fitted with a CASH  orthosis brace when out of bed.  She had conservative care, pain management,  and has been mobilized.  She has had followup from her primary care  physician for chronic atrial fibrillation and has been on chronic Coumadin  with rate control.   REVIEW OF SYSTEMS:  Positive for reflux, joint swelling, lumbago.  She is  denying chest pain, nausea or vomiting, but has a sensitive stomach.   PAST MEDICAL HISTORY:  1.  Hypertension.  2.  Osteoporosis.  She has tried Fosamax or Actonel but could not tolerate      this from a GI standpoint.  3.  Vertigo.   PAST SURGICAL HISTORY:  1.  Right hip fracture.  She had right [facility] January 2004, went to inpatient      rehabilitation.  2.  Left hip fracture January 2004.  Had extended care facility admission.   SOCIAL HISTORY:  Resident of [facility] x 1 year.  Negative ETOH, negative tobacco.   FUNCTIONAL HISTORY:  Independent with a walker prior to admission.  Made own  breakfast and lunch and made it to the dining room for dinner.  Current  functional status supervision bed mobility, minimum assistance transfers,  minimum assistance gait and able to feed herself.   LABORATORY DATA AND OTHER STUDIES:  Last hemoglobin 11.2.  Last INR 1.7.  Last BUN 15, creatinine 1.1.  Last potassium 3.9.   ALLERGIES:  None known.   CURRENT MEDICATIONS:  Tylenol, Phenergan, Trazodone, sorbitol, Lasix,  Avapro, Cardizem CD, Os-Cal, Ultracet, subcutaneous Lovenox 1 mg/kg until  INR 2.0.   PHYSICAL EXAMINATION:  VITAL SIGNS:  Blood pressure 137/52, pulse 80,  respirations 18, temperature 97.  GENERAL:  Elderly female in no acute distress, wearing a brace, good  posture, no pain.  Eating, feeding herself.  EYES:  Anicteric, not injected.  ENT:  Normal.  NECK:  Supple without adenopathy.  LUNGS:  Clear.  HEART:  Irregularly irregular.  No rubs, murmurs, or extra sounds.  EXTREMITIES:  Without edema.  Good sensation bilateral lower extremities.  Good range of motion bilateral lower extremities  except hips not tested.  Sensation intact.  Orientation x 3.  Motor strength testing bilateral  deltoid, biceps, triceps, and grip 4/5; bilateral hip flexors and quadriceps  3+, bilateral TA and gastrocnemius 4.   IMPRESSION:  1.  Functional deficits due to multiple compression fracture T11, T12, L1,      L4.  2.  Pain management with Ultracet p.r.n.  3.  Chronic atrial fibrillation on chronic Coumadin, INR 1.7.  Lovenox until      INR greater than 2.  4.  Hypertension.  Lasix, Avapro, Cardizem.  5.  Osteoporosis on Os-Cal.   Estimated length of stay 7 to 10 days.   Good rehab candidate.  She has requested No Code Blue status, confirmed  this with her son as well, and had her sign a No Code Blue sheet as well  as written order in condition order section.      AEK/MEDQ  D:  12/26/2004  T:  12/26/2004  Job:  [id number]   cc:   Loreta Ave, M.D.  [address]  [place] [address]  Fax: [phone]   Hal T. Stoneking, M.D.  [address] [place], [place] [address]  Fax: [phone]

## 2011-01-12 NOTE — Discharge Summary (Signed)
NAME:  [name], [name]                          ACCOUNT NO.:  [id number]   MEDICAL RECORD NO.:  [id number]                   PATIENT TYPE:  INP   LOCATION:  5020                                 FACILITY:  [facility]   PHYSICIAN:  Dyke Brackett, M.D.                 DATE OF BIRTH:  1908/01/17   DATE OF ADMISSION:  07/28/2002  DATE OF DISCHARGE:  08/01/2002                                 DISCHARGE SUMMARY   ADMISSION DIAGNOSES:  1. Right hip subcapital femur fracture, nondisplaced.  2. Hypertension.  3. Labyrinthitis.  4. Chronic atrial fibrillation, on Coumadin therapy.   DISCHARGE DIAGNOSES:  1. Right subcapital femur fracture, nondisplaced, status post cannulated     pinning.  2. Hypertension.  3. Labyrinthitis.  4. Chronic atrial fibrillation, on Coumadin therapy.   SURGICAL PROCEDURE:  On July 29, 2002, the patient underwent a cannulated  pinning of her right subcapital femur fracture by Dr. Marcie Mowers,  assisted by Legrand Pitts. Duffy, P.A.C.   COMPLICATIONS:  None.   CONSULTANTS:  1. Pharmacy consult to reverse her Coumadin with vitamin K on July 28, 2002.  2. Internal medicine consult by Dr. Ann Maki T. Stoneking on July 29, 2002.  3. Physical therapy, rehab medicine and case management consults, July 30, 2002.  4. Occupational therapy consult, August 01, 2002.   HISTORY OF PRESENT ILLNESS:  This 77 year old white female presented to Dr.  [name] office with a history of falling at home on the day before  admission and landing on her right side.  She complained of pain in the  right hip and difficulty with ambulation.  Dr. Pete Glatter took x-rays, which  showed she had a nondisplaced subcapital femoral neck fracture.  She was  referred to our office for further workup and then subsequently admitted for  surgical pinning.   HOSPITAL COURSE:  She was given vitamin K on admission, with the dose  adjusted per pharmacy to reverse her Coumadin.  Dr.  Pete Glatter thought she  was stable for surgery, once her INR was within therapeutic range.  She was  able to be fixed on July 29, 2002.  She tolerated surgery without  complication.   On postop day 1, she was doing well, afebrile, vital signs stable.  Minimal  complaints of pain.  Hemoglobin 12.8, hematocrit 38.3.  She was started on  therapy per protocol.   She had a pretty much unremarkable postoperative course.  She remained  pretty much afebrile with her vital signs stable, pain well-controlled.  She  was able to be switched to p.o. medications and a bed became available for  her in rehab and she was able to be transferred to rehab on August 01, 2002.    DISCHARGE INSTRUCTIONS:  1. Diet:  She can continue her current hospitalization diet.  2. Medications:  Continue current hospitalization medications  with     adjustments to be made per the rehab physicians.  3. Activity:  She is to be out of bed partial weightbearing of 50% on the     right leg with the use of the walker.  She is to continue PT and OT per     rehab protocols.  4. Wound care:  Keep the right hip incision clean and dry and clean it with     Betadine daily.  Apply dry dressing.  Staples can be removed, with Steri-     Strips with Benzoin applied about postop day 12 to 14; that can be done     in rehab prior to discharge.   FOLLOWUP:  She needs to follow up with Dr. [name] in our office about  postop day 14 if her staples are not removed prior to discharge from rehab;  otherwise, she needs followup in our office about two weeks after her  discharge from rehab; she is to call [phone] for that appointment.  Follow  up with Dr. Pete Glatter per his office.   LABORATORY DATA:  Chest x-ray done July 28, 2002 showed cardiomegaly, no  active disease.  X-ray of the right hip on July 29, 2002 showed  cannulated pinning of the right hip without evidence of complicating  features.   On December 2nd, white count  was 12.7.  On December 4th, white count 12.2.  On December 5th, white count 11, hemoglobin 11.9, hematocrit 35.3 and on  December 6th, white count 11.9, hemoglobin 12.4, hematocrit 36.6 and  platelets 345,000.   On December 2nd, her PT was 24 with an INR of 2.5 and a PTT of 68.  On  December 3rd at 1 a.m., INR was 2.7, at 5:30 it was 2.4, and then at 9:45 in  the morning, it was 1.8.  On December 4th, PT was 15.5 with an INR of 1.2,  and on December 5th, PT was 15.3, INR 1.2.   On December 2nd, sodium was 129, potassium 2.8, chloride 90, glucose 132.  On December 3rd, potassium 3.2.  On December 4th, sodium 130, potassium 3.8,  glucose 112.  On December 5th, sodium was 132, potassium 3.8, glucose 119  and calcium 8.3.  All other laboratory studies were within normal limits.     Legrand Pitts Marshall Cork, M.D.    KED/MEDQ  D:  09/03/2002  T:  09/04/2002  Job:  [id number]   cc:   Hal T. Stoneking, M.D.  [address] [place], [place] [address]  Fax: [phone]

## 2011-01-12 NOTE — H&P (Signed)
NAME:  [name], [name]              ACCOUNT NO.:  [id number]   MEDICAL RECORD NO.:  [id number]          PATIENT TYPE:  EMS   LOCATION:  MAJO                         FACILITY:  [facility]   PHYSICIAN:  Marcene Duos, M.D.DATE OF BIRTH:  Jan 21, 1908   DATE OF ADMISSION:  11/10/2005  DATE OF DISCHARGE:                                HISTORY & PHYSICAL   CHIEF COMPLAINT:  Anorexia, abnormal laboratories.   HISTORY OF PRESENT ILLNESS:  This is a 77 year old female patient of Dr. Merlene Laughter with abnormal laboratories called in from home health nurse today  with a BUN and creatinine of around 109 and 2.5, respectively and a white  blood cell count of greater than 17,000 who was subsequently referred to the  emergency department for further evaluation and probable admission.  The  patient has a history of vomiting illness of vomiting and diarrhea  approximately three to four weeks ago for approximately three days.  Since  that time she has had poor p.o. intake with gradually worsening weakness.  Prior to the illness she took Zaroxolyn 2.5 mg x1 dose due to resistant  peripheral edema.  Has had none of the Zaroxolyn since.  However, she has  been treated with Lasix 80 mg p.o. b.i.d. for the peripheral edema until  approximately one and a half weeks ago.  She denies cough, fever, abdominal  pain, melena, or hematochezia.  Her urine output has been diminished.  She  is uncertain if it has appeared dark in color.   PAST MEDICAL HISTORY:  1.  Osteoporosis with vertebral compression fractures at T12, L1, L4 with      chronic pain.  2.  Bilateral hip fractures both in 2004.  3.  Hypertension.  4.  Benign positional vertigo.  5.  Chronic atrial fibrillation on Coumadin.  6.  Degenerative joint disease.   PAST SURGICAL HISTORY:  1.  Cholecystectomy.  2.  Bilateral hip replacements both occurring in 2004 after fractures.   MEDICATIONS:  1.  Was on Cymbalta 30 mg p.o. daily.  She is  currently not taking.  2.  Lasix 80 mg p.o. b.i.d.  Discontinued one and a half weeks ago.  3.  Coumadin 1 mg daily.  4.  Diovan 80 mg daily.  5.  Tiazac 240 mg daily.   ALLERGIES:  No known drug allergies.   Tobacco:  No use.  Alcohol:  No use.   SOCIAL HISTORY:  Widowed.  She is in independent living at [facility].   FAMILY HISTORY:  Father died age 74 of old age.  Mother died age 63 of a  stroke.  She had nine siblings.  Six are still living.  Unclear as to what  their health problems have been.  Son with atrial fibrillation and another  son suffered from polio with chronic effects when he was young.  Daughter  died in her 22s from what sounds like cardiac effects of rheumatic fever she  suffered as a child.   PHYSICAL EXAMINATION:  VITAL SIGNS:  Temperature 97.1, blood pressure  159/69, heart rate 76, respirations 16, 95% room air.  GENERAL:  Patient is surprisingly alert, in no acute distress.  HEENT:  Pupils are equal, round, and reactive to light.  Tympanic membranes  are pearly gray.  Throat is without injection.  Mucous membranes are dry.  NECK:  Supple without adenopathy.  No thyromegaly.  CHEST:  Clear.  CARDIOVASCULAR:  Irregularly irregular.  Carotid, radial, femoral, DT pulses  normodynamic and equal.  She has flaking scabbed skin of her pretibial areas  bilaterally with a woodiness to the skin, but no current edema.  ABDOMEN:  Protuberant, but soft, nontender.  No organomegaly or masses  appreciated.  Bowel sounds present throughout.  NEUROLOGIC:  Alert and oriented x3.  Cranial nerves II-XII grossly intact.  No focal findings.   LABORATORIES:  In the emergency department white count 17.2 with 74%  neutrophils, 19% lymphocytes, 7% monocytes, hemoglobin 15.6, platelet count  423.  INR of 6.6.  A set of cardiac enzymes negative for injury.  UA  surprisingly shows a specific gravity of 1.013, negative leukocyte esterase,  nitrite.  Sodium 136, potassium 2.4, glucose  124, BUN 91, and creatinine  2.1.  EKG shows atrial fibrillation with poor R-wave progression, ST  depression inferior lateral leads.  This is new from EKG we have in ER of  2004.  No other EKGs currently to compare.   ASSESSMENT/PLAN:  1.  Uremia.  Appears prerenal.  Will check stool guaiac.  Spot urine sodium      and with her history of poor p.o. intake and one month of attempts at      diuresis for peripheral edema prerenal is most likely cause.  Hydrate.      If no improvement quickly will need further work-up probably including      renal ultrasound.  She is not in failure but with her EKG changes will      need to check against records from 2006 and will ask to obtain those.  2.  Leukocytosis.  Chest x-ray pending.  No source of infection by history      or work-up at this point.  Question stress and dehydration.  Recheck CBC      in the morning.  3.  Over anticoagulation.  Holding Coumadin and follow INR.  4.  Hypokalemia.  Is receiving one run over 10 mEq intravenous potassium in      the emergency department and will continue to replace BMET in the      morning.  5.  Hypertension.  Blood pressure up a bit.  Will hold her Diovan; however,      with elevation of BUN and creatinine and continue Tiazac for now.           ______________________________  Marcene Duos, M.D.     EMM/MEDQ  D:  11/11/2005  T:  11/12/2005  Job:  [id number]

## 2011-01-12 NOTE — Discharge Summary (Signed)
[name], [name]                          ACCOUNT NO.:  [id number]   MEDICAL RECORD NO.:  [id number]                   PATIENT TYPE:  INP   LOCATION:  4739                                 FACILITY:  [facility]   PHYSICIAN:  [name], M.D.             DATE OF BIRTH:  05/04/1908   DATE OF ADMISSION:  03/29/2003  DATE OF DISCHARGE:  04/06/2003                                 DISCHARGE SUMMARY   PHYSICIANS:  1. [name], M.D., primary care physician.  2. [name], M.D., orthopedic surgeon.   DISCHARGE DIAGNOSES:  1. Left hip fracture secondary to osteoporosis, status post hemiarthroplasty     by Dr. Jonny Ruiz L. Le.  2. Atrial fibrillation on chronic anticoagulation, stable.  INR is 2.1.  3. Hypertension, stable.  4. Leukocytosis secondary to fracture, resolved.   DISCHARGE MEDICATIONS:  1. Capoten 25 mg p.o. b.i.d.  2. Colace 100 mg p.o. b.i.d.  3. Senokot 8.6 mg b.i.d. with meals.  4. Trinsicon 1 capsule t.i.d.  5. Coumadin 5 mg p.o. q.p.m.  6. Cardizem CD 240 mg p.o. q.d.  7. Reglan 10 mg p.o. q.8h. p.r.n. nausea.  8. Tylenol 325 mg 650 mg p.o. q.4h. p.r.n.  9. Robaxin 500 mg p.o. q.6h. p.r.n.  10.      Restoril 15 mg p.o. q.h.s. p.r.n.  11.      Tylox 1-2 tablet 5/325 mg strength p.o. q.4-6h. p.r.n.   ALLERGIES:  No known drug allergies.   PROCEDURE:  The patient had left hemiarthroplasty of left hip performed by  Dr. Jonny Ruiz L. Le on March 31, 2003.  She tolerated the procedure well and  there were no complications.   HISTORY OF PRESENT ILLNESS:  This is a woman with a history of hypertension,  chronic atrial fibrillation, osteoporosis, and benign positional vertigo.  She slipped and fell and fractured her left hip at home.  Because the  patient is chronically anticoagulated, she was not able to go to the  operating room on the day of her injury. She was admitted to the hospital  and placed in Buck's traction to reduce the pain and her INR  was treated  with vitamin K.   HOSPITAL COURSE:  The patient was admitted to the telemetry unit.  She  remained in atrial fibrillation with good rate control throughout her stay.  Her INR was reversed with vitamin K as noted.  Once her INR reached 1.5, the  orthopedics team did take the patient to OR.  She performed her  hemiarthroplasty which she tolerated well.   After surgery, the patient was bridged with Lovenox until she reached a  therapeutic INR of 1.2.  She did exhibit some Coumadin resistance secondary  to having been given vitamin K; however, at the time of discharge, her INR  is therapeutic.  She will need close follow up of her INR and adjustment of  her Coumadin dosage  over the next one to two weeks.   The patient was markedly leukocytotic prior to surgery; however, after her  repair, her white blood cell count dropped to 12.1 indicating that the  leukocytosis was secondary to her hip fracture.   The patient's hypertension remained well controlled throughout her stay. She  was maintained on her outpatient medication regimen.   Because the patient did recently sustain a right hip fracture from which she  was recuperating when this accident occurred, she is quite deconditioned and  will need a relatively extensive physical therapy at the time of discharge.  The patient was evaluated by care management, physical therapy, occupational  therapy, and the SACU team at [facility]  She was found  to be appropriate for SACU level of physical therapy; however, there are no  beds available at this time.  Therefore, the patient's family has chosen to  send her to [facility] for physical  therapy and reconditioning.  The patient is agreeable to this plan.   Vital signs at the time of dictation include temperature of 98.1, blood  pressure 142/68, pulse 89, respirations 18.  Room air saturations are 96%.  The patient states she  only has pain with movement.  She is highly motivated  to do well in physical therapy and in fact does as much for herself as  possible at this time in the inpatient setting.  She is free of chest pain,  shortness of breath, palpitations, or other complaints.   LABORATORY DATA:  Hemoglobin 9.6, hematocrit 27.8, white blood cell count  12.6, platelet count 311,000.  Sodium 135, potassium 3.8, glucose 124, BUN  33, creatinine 1.3, calcium 8.5.  AST 33, ALT 28, ALP 93, total bilirubin  1.0.   CONSULTATIONS:  [name], M.D., orthopedics.   CONDITION ON DISCHARGE:  Good.   DISPOSITION:  Discharge to skilled nursing facility for rehabilitation.   FOLLOW UP:  The patient will require daily INR and adjustment of Coumadin  dosage.  Follow-up with her orthopedic surgeon should be scheduled in four  to six weeks unless problems arise prior to that time.      Ellender Hose. Christian Mate, M.D.    SMD/MEDQ  D:  04/05/2003  T:  04/05/2003  Job:  [id number]   cc:   [name] III, M.D.  [address] [place]  [place] [address]  Fax: [phone]   [name], M.D.  [address]  [place]  [place] [address]  Fax: [phone]   [name], M.D.  [address] [place], [place] [address]  Fax: [phone]

## 2011-01-12 NOTE — Discharge Summary (Signed)
[name], [name]              ACCOUNT NO.:  [id number]   MEDICAL RECORD NO.:  [id number]          PATIENT TYPE:  ORB   LOCATION:  4526                         FACILITY:  [facility]   PHYSICIAN:  Erick Colace, M.D.DATE OF BIRTH:  [birth year]   DATE OF ADMISSION:  12/26/2004  DATE OF DISCHARGE:  01/02/2005                                 DISCHARGE SUMMARY   DISCHARGE DIAGNOSES:  1.  Multiple compression fractures, T11-12 and L1-L4.  2.  Pain control.  3.  Chronic atrial fibrillation.  4.  Hypertension.  5.  Osteoporosis.   HISTORY OF PRESENT ILLNESS:  A 77 year old white female resident of  [facility], with history of a right total hip  replacement in January 2004, which she received inpatient rehab services  for.  Patient now admitted December 20, 2004 after recent fall, with increased  midthoracic back pain.  MRI with T12, L1, and L4 compression fractures.  Orthopedics service, Dr. Eulah Pont.  Follow-up scan also with findings of T11  compression fracture.  Fitted with CASH orthosis back brace when out of bed,  conservative care, pain management.  She remained on chronic Coumadin for  atrial fibrillation, followed by primary care Pierson Vantol, with cardiac rate  controlled.  She was admitted to subacute care services.   PAST MEDICAL HISTORY:  See discharge diagnoses.  No alcohol or tobacco.   SOCIAL HISTORY:  Resident of [facility] x 1  year.   MEDICATIONS PRIOR TO ADMISSION:  1.  Coumadin 2 mg alternating with a 1-1/2 tablets every third day.  2.  Lasix 40 mg alternating with 80 mg every other day.  3.  Diovan 80 mg daily.  4.  Tiazac 240 mg daily.  5.  Calcium b.i.d.   HOSPITAL COURSE:  Patient with progressive gains while on rehab services,  with therapies initiated daily.  The following issues were followed during  the patient's rehab course.  Pertaining to [name]'s multiple compression  fractures, she had been  fitted with a back brace when out of bed.  Conservative care, pain management with Ultram as needed.  Neurovascular  sensation intact.  She is ambulating short household distances with an  assistive device.  She had no bowel or bladder disturbances.  Pain  management ongoing with the use of Ultram, as noted above.  Chronic Coumadin  for atrial fibrillation, with cardiac rate controlled, latest INR 2.2,  followed by Dr. Merlene Laughter, with home health nursing arranged per  [facility], to continue to follow Coumadin therapy.  Blood pressures  monitored with Lasix, Avapro, and Cardizem, placed to a standing 40 mg daily  with monitoring for any fluid overload, which she did not exhibit during her  rehab stay.  Oxygen saturations of 92% on room air.  Overall, for her  functional status, she was ambulating household distances with a walker,  minimal assist for lower body dressing, supervision upper body.  Strength  and endurance are greatly improved.  She was encouraged with her overall  progress.  Plan was to be discharged back to [facility].  Her appetite remained somewhat limited, although this did improve  with her mobility.  She was placed on Megace to help stimulate appetite  during her rehab stay.   Latest labs showed sodium 139, potassium 3.4 with supplement, BUN 29,  creatinine 1.2.  INR 2.2.  Hemoglobin 11.5, hematocrit 34.5.   DISCHARGE MEDICATIONS AT TIME OF DICTATION:  1.  Coumadin 2 mg, dose to be established at time of discharge.  2.  Tiazac 240 mg daily.  3.  Diovan 80 mg daily.  4.  Potassium chloride 20 mEq daily.  5.  Lasix 40 mg daily.  6.  MiraLax daily.  7.  Ultram 50 mg q.6 h. as needed for pain.   SPECIAL INSTRUCTIONS:  Home health nursing per [facility], to check  INR on Wednesday Jan 03, 2005.  Results to Dr. Merlene Laughter.   DIET:  Regular.   She is weightbearing as tolerated with back brace when out of bed.       DA/MEDQ  D:  01/01/2005  T:  01/01/2005  Job:  [id number]   cc:   Erick Colace, M.D.  [address]  [place] [address]  Fax: [phone]   Hal T. Stoneking, M.D.  [address]  [place], [place] [address]  Fax: [phone]   Loreta Ave, M.D.  [address]  [place] [address]  Fax: [phone]

## 2011-01-12 NOTE — Op Note (Signed)
   NAME:  [name], [name]                          ACCOUNT NO.:  [id number]   MEDICAL RECORD NO.:  [id number]                   PATIENT TYPE:  INP   LOCATION:  5020                                 FACILITY:  [facility]   PHYSICIAN:  Thera Flake., M.D.             DATE OF BIRTH:  1908/04/27   DATE OF PROCEDURE:  07/29/2002  DATE OF DISCHARGE:                                 OPERATIVE REPORT   PREOPERATIVE DIAGNOSIS:  Valgus impacted right femoral neck fracture.   POSTOPERATIVE DIAGNOSIS:  Valgus impacted right femoral neck fracture.   OPERATION:  Percutaneous pinning, right femoral neck fracture with three Ace  titanium 6.5-mm screws.   SURGEON:  Dyke Brackett, M.D.   ASSISTANT:  Legrand Pitts. Duffy, P.A.   DESCRIPTION OF PROCEDURE:  Sterile prep and drape.  With the leg slightly  internally rotated, limited lateral approach to the hip made with splitting  of the iliotibial band and vastus lateralis, identification of the vastus  ridge.  Insertion of three guide pins, measurement of the pins to be between  80 and 85 mm.  Three titanium screws were placed over the pins with power,  minor adjustments made.  Screws were well within the femoral head avoiding  the superior weightbearing quadrant of the hip.  Leg was rotated under C-arm  fluoroscopy, and again the screws were all well within the femoral head.  The wound was copiously irrigated, closing the vastus lateralis and  iliotibial band with running 0 Ethibond with 2-0 Vicryl and skin clips.  Marcaine with epinephrine infiltrated at the skin and light sterile dressing  applied.  The patient was taken to the recovery room in stable condition.                                               Thera Flake., M.D.    WDC/MEDQ  D:  07/29/2002  T:  07/29/2002  Job:  [id number]

## 2011-01-12 NOTE — Op Note (Signed)
NAME:  [name], [name]                          ACCOUNT NO.:  [id number]   MEDICAL RECORD NO.:  [id number]                   PATIENT TYPE:  INP   LOCATION:  4739                                 FACILITY:  [facility]   PHYSICIAN:  John L. Rendall III, M.D.           DATE OF BIRTH:  06/16/1908   DATE OF PROCEDURE:  03/31/2003  DATE OF DISCHARGE:                                 OPERATIVE REPORT   PREOPERATIVE DIAGNOSIS:  Displaced femoral neck fracture, left hip.   POSTOPERATIVE DIAGNOSIS:  Displaced femoral neck fracture, left hip.   OPERATIVE PROCEDURE:  Hemiarthroplasty, left hip.   SURGEON:  John L. Rendall, M.D.   ASSISTANT:  Madilyn Fireman, P.A.-C.   ANESTHESIA:  General.   PATHOLOGY:  The patient's fracture was consistent with standard osteoporosis  fracture, bone quality consistent with the same.   PROCEDURE:  Under general anesthesia, the left hip is prepared with DuraPrep  and draped as a sterile field in the right lateral decubitus position.  A  posterior approach was made, splitting skin, subcutaneous tissue, and IT  band in the line of its fibers inserting a short Charnley retractor.  The  short external rotators and hip capsule are taken down from bone with  electrocautery taking down the piriformis.  The hip capsule was opened in a  T-shaped manner and the fracture was immediately visible.  The superior  femoral neck is exposed, the IM initiator is used, the canal finder is used,  the canal is then progressively reamed up to 14.5 mm.  The femoral neck is  then cut.  The fracture is low on the neck so a relatively low neck cut is  made.  The canal is then rasped with the 12 mm, 13.5 mm, and 15 mm rasps,  all of which give progressively better fit and fill with the 15 being  perfect size.  Calcar reamers were used to give an excellent smooth calcar.  The hip ball is then removed from the acetabulum.  It is measured between a  45 and 46, trial reduction of a 45 and a 46 hip  ball revealed better suction  fit with the 46.  Trial reduction then with the rasp and a +5 neck length  and the 46 ball revealed excellent fit, alignment and stability.  Permanent  components are then obtained and while they are being opened, the hip and  acetabulum are washed out with antibiotic solution.  The components are  inserted, the hip is reduced.  The hip capsule and piriformis are closed and  reattached respectively with #1 Tycron.  The IT band was closed with #1  Tycron, subcu with 0 and 2-0 Vicryl, and the skin with clips.  Operative  time under 40 minutes.  Blood loss under 200 mL.  The patient tolerated the  procedure well and returned to the recovery room in good condition.  John L. Dorothyann Gibbs, M.D.    Renato Gails  D:  03/31/2003  T:  03/31/2003  Job:  [id number]

## 2011-01-12 NOTE — H&P (Signed)
NAMEKAYREN, HOLCK                          ACCOUNT NO.:  [id number]   MEDICAL RECORD NO.:  [id number]                   PATIENT TYPE:  EMS   LOCATION:  MAJO                                 FACILITY:  [facility]   PHYSICIAN:  Georgann Housekeeper, M.D.                 DATE OF BIRTH:  Jul 05, 1908   DATE OF ADMISSION:  03/29/2003  DATE OF DISCHARGE:                                HISTORY & PHYSICAL   PRIMARY CARE PHYSICIAN:  Hal T. Stoneking, M.D.   ORTHOPEDIC PHYSICIANS:  1. Dyke Brackett, M.D.  2. Loreta Ave, M.D.   CHIEF COMPLAINT:  Left hip fracture after fall, chronic atrial fibrillation,  and hypertension.   HISTORY:  The patient is a 77 year old female with history of hypertension,  chronic atrial fibrillation, osteoporosis, benign positional vertigo.  She  was getting up from the chair with a walker and she slipped and fell and she  got a left hip fracture which will require a complete hip replacement  because of the impact of the total fracture of the hip.  She was seen by Dr.  Eulah Pont, orthopedics.  The patient, because of Coumadin--INR is 2--would not  be able to take her to the operating room tonight.  The patient was admitted  to have the Coumadin level drop down.  As far as the patient's review of  systems, denies any fevers.  No chest pain, no shortness of breath.  No  nausea, vomiting, or diarrhea.  No palpitations  No abdominal pain.  She was  in some pain but has already got the morphine before, at the time of the  exam was comfortable.   PAST MEDICAL HISTORY:  1. Chronic atrial fibrillation.  2. Hypertension.  3. Benign positional vertigo and labyrinthitis.  4. Osteoporosis.  5. Osteoarthritis.  6. Mild hyponatremia secondary to hydrochlorothiazide.   PAST SURGICAL HISTORY:  Right hip replacement with screws in December 2003.   MEDICATIONS:  1. Captopril 25 mg b.i.d.  2. Tiazac 120 mg daily.  3. Coumadin 2 mg daily.  4. Tranxene 3.75 q.p.m.  5. Antivert  12.5 q.h.s. p.r.n.   ALLERGIES:  None.   SOCIAL HISTORY:  She lives with her friend.  Has been getting around well  after he rehab in December hip surgery.  No tobacco, no alcohol.   REVIEW OF SYSTEMS:  As mentioned above.   FAMILY HISTORY:  Parkinson's and stroke.   PHYSICAL EXAMINATION:  VITAL SIGNS:  Temperature 98.6, blood pressure  136/76, pulse 50, respirations 19.  Saturations are 98% on room air.  GENERAL:  Awake, alert, in some pain but answers to questions.  HEENT:  Pupils reactive, equal.  Extraocular muscles were intact.  Oropharynx was clear.  NECK:  Supple.  No JVD.  LUNGS:  Clear.  CARDIAC:  Regular rhythm.  S1, S2 with 1/6 systolic murmur.  ABDOMEN:  Soft, positive bowel sounds, nontender.  EXTREMITIES:  There was no edema.  She had good pulses on the dorsalis  pedis.   LABORATORY DATA:  White count of 15.7, hemoglobin of 14, platelets 357.  Chemistries are pending.  INR was 2, PT of 19.2.  UA was negative.   EKG showed chronic atrial fibrillation without any ST-T wave changes with a  rate of 50-60.  Chest x-ray:  No acute disease.  Left hip showed fracture of  the left hip.   IMPRESSION:  A 77 year old female with atrial fibrillation and hypertension  with left hip fracture.  Coumadin level of INR of 2.   PLAN:  Admit to telemetry.  Orthopedic consult, Dr. Eulah Pont.  Pain  medications, morphine intravenously.  As far as anticoagulation, her INR is  2.  We will get another one in the morning, hold the Coumadin, give her  vitamin K 2 mg subcu.  As far as hypertension, continue blood pressure  medications.  Chronic atrial fibrillation on rate control.  I discussed the  risk for cardiac with the patient and her son.  Risk of cardiac mortality is  about close to 15%-20%.  The patient is Do Not Resuscitate and has a living  will.                                                Georgann Housekeeper, M.D.    Arliss Journey  D:  03/29/2003  T:  03/30/2003  Job:  [id number]

## 2011-05-15 IMAGING — CT CT ABD-PELV W/O CM
2 of 4 series · 17 of 46 positions shown, 19 images · non-contrast
Comparison: 08/07/2009 lumbar spine

CLINICAL DATA: Abdominal and pelvic pain.

CT ABDOMEN AND PELVIS WITHOUT CONTRAST
TECHNIQUE: Multidetector CT imaging of the abdomen and pelvis was
performed following the standard protocol without intravenous
contrast.

[Series 2: rtn a/p w/o · axial · non-contrast · 0.78mm/px · z∈[-384,-19]mm · 14 of 81 slices shown, 16 images]
[im 4/81  soft-tissue]
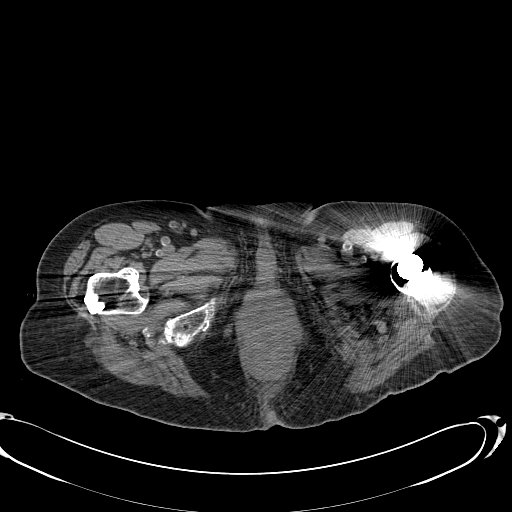
[im 4/81  bone]
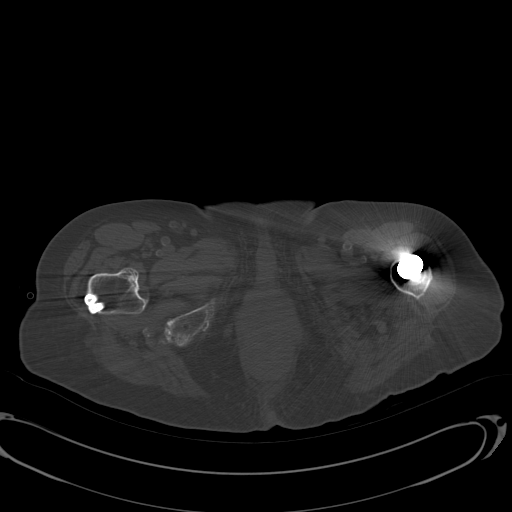
[im 10/81  soft-tissue]
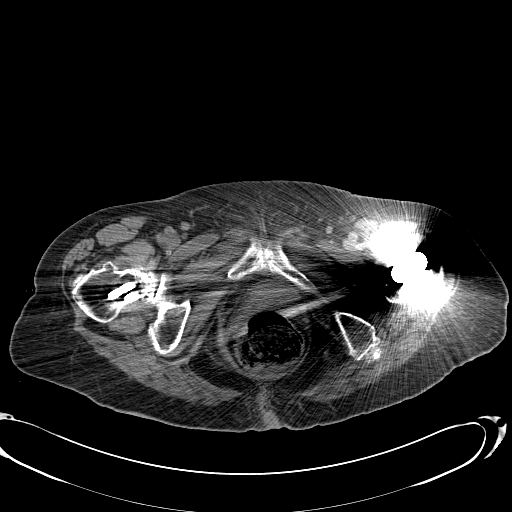
[im 16/81  soft-tissue]
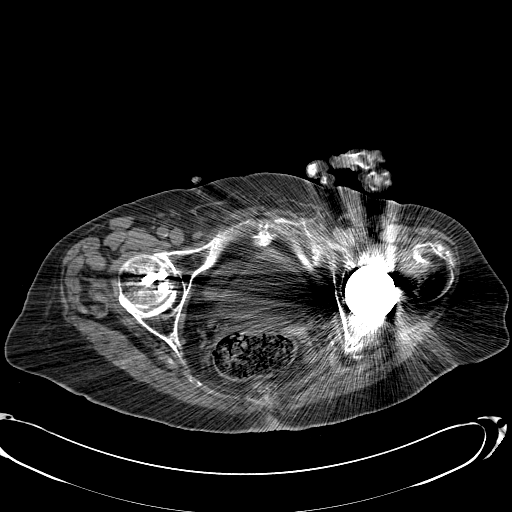
[im 22/81  soft-tissue]
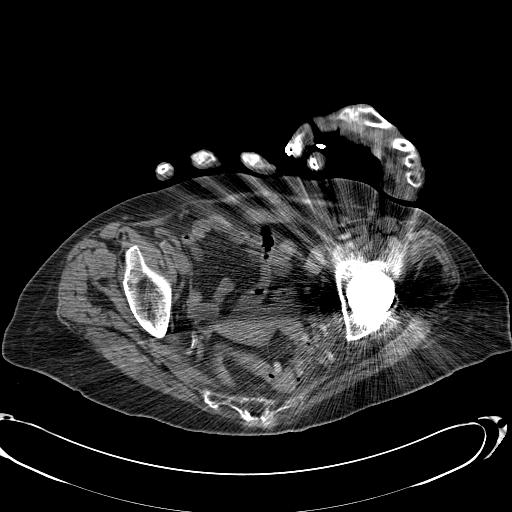
[im 28/81  soft-tissue]
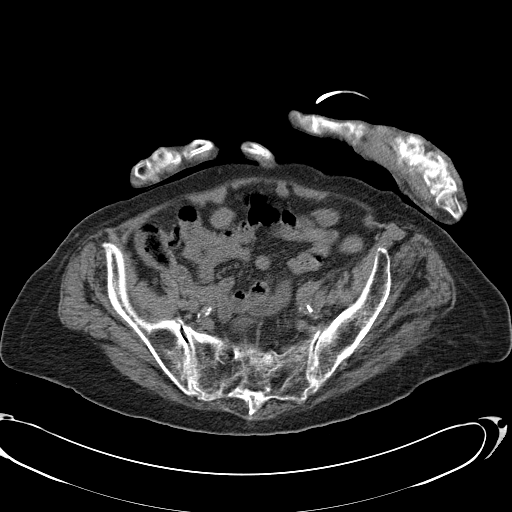
[im 31/81  soft-tissue]
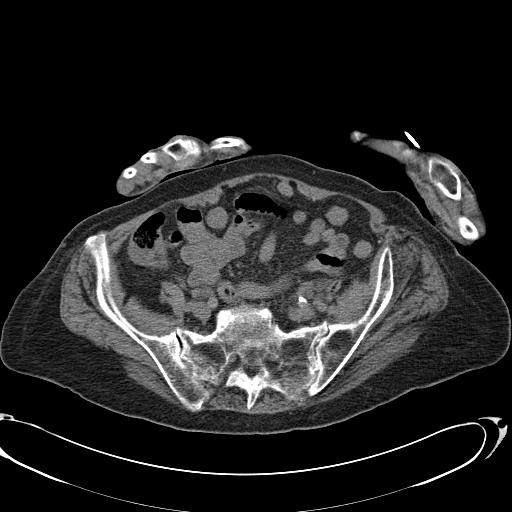
[im 37/81  soft-tissue]
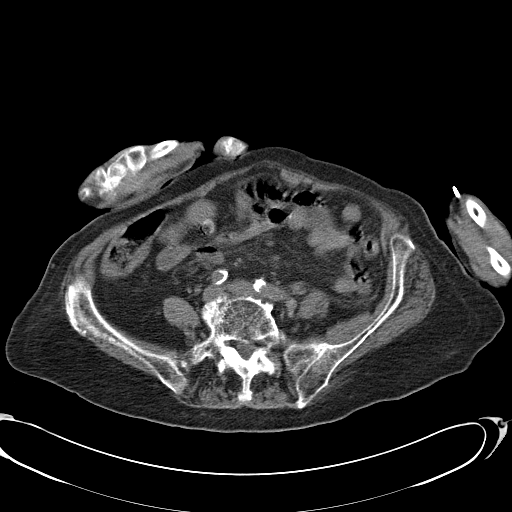
[im 44/81  soft-tissue]
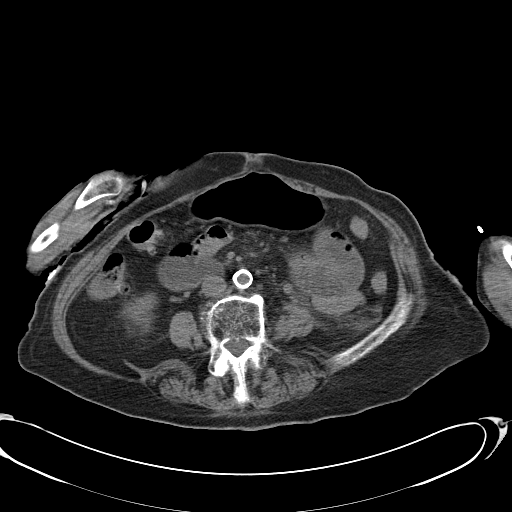
[im 50/81  soft-tissue]
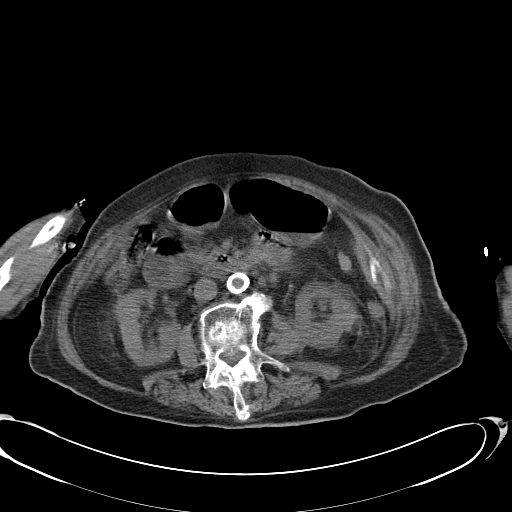
[im 50/81  bone]
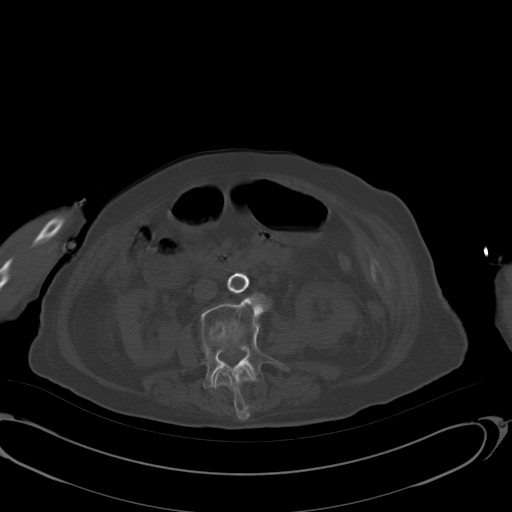
[im 53/81  soft-tissue]
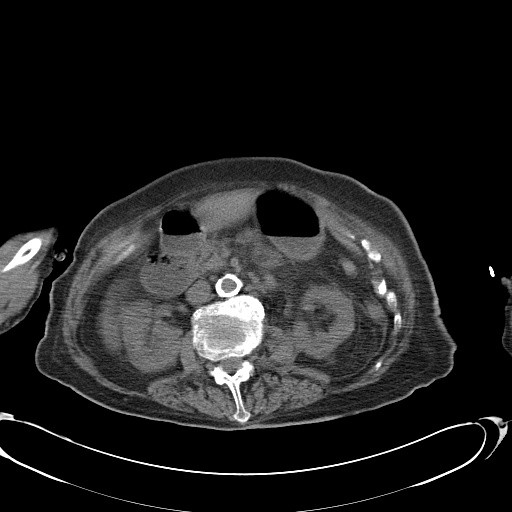
[im 59/81  soft-tissue]
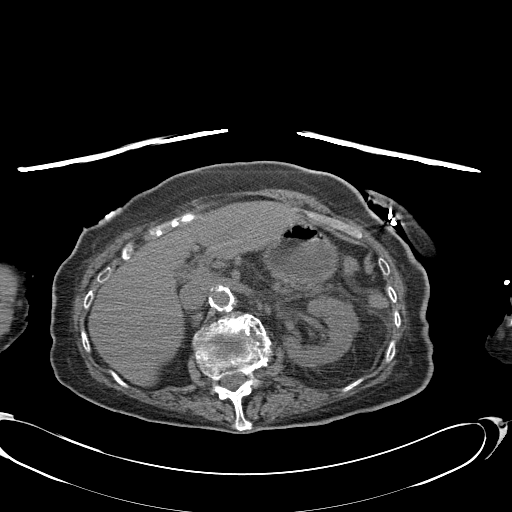
[im 65/81  soft-tissue]
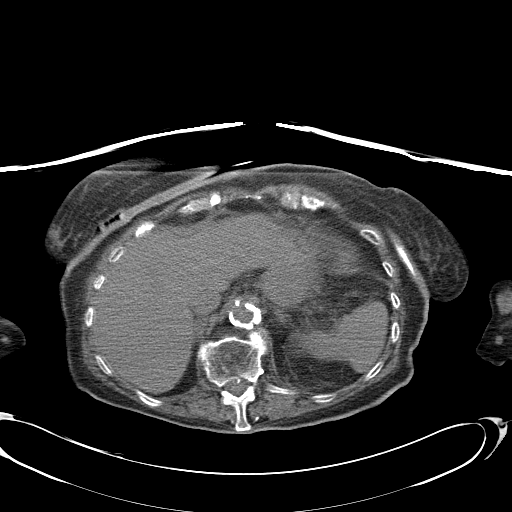
[im 71/81  soft-tissue]
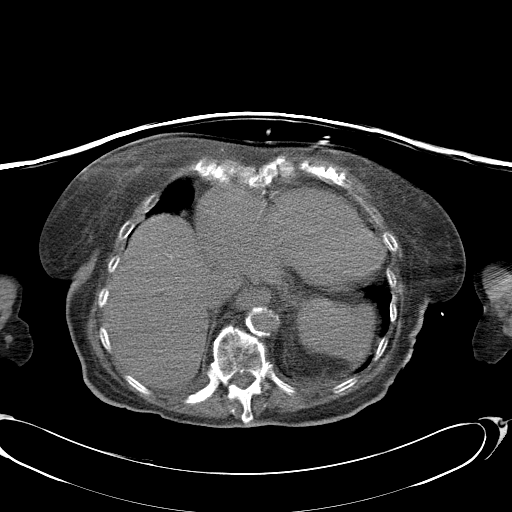
[im 77/81  soft-tissue]
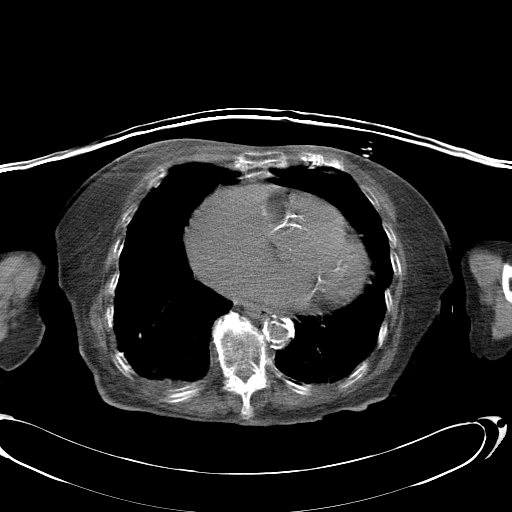

[Series 602: <mpr thick range> · coronal · 0.78mm/px · 3 of 68 slices shown]
[im 23/68  soft-tissue]
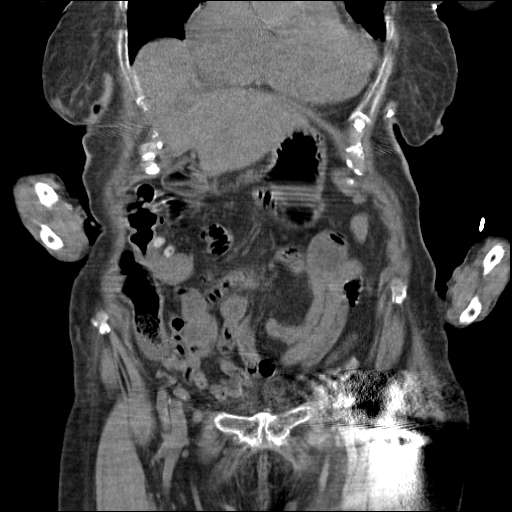
[im 30/68  soft-tissue]
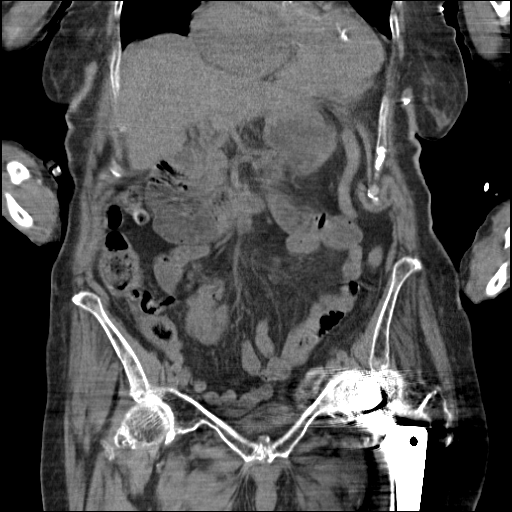
[im 38/68  soft-tissue]
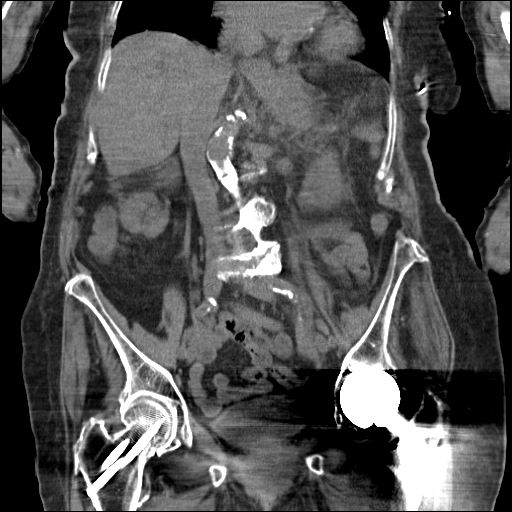

[17 of 46 positions shown; findings below may reference images not displayed]

FINDINGS: Cardiomegaly and coronary artery calcifications are
identified.
A tiny right pleural effusion and bibasilar atelectasis noted.

The liver, spleen, pancreas and adrenal glands are unremarkable.
Mild perinephric stranding is of uncertain chronicity.
There is no evidence of free fluid, enlarged lymph nodes, biliary
dilatation or abdominal aortic aneurysm.

Please note that parenchymal abnormalities may be missed as
intravenous contrast was not administered.
Colonic diverticulosis is present without evidence of
diverticulitis.
The bowel is otherwise unremarkable.

A left hip replacement and screws in the proximal right femur are
noted.
Diffuse osteopenia is present.
Compression fractures of T11, L1 and L3 are again noted.  The L3
fracture may be slightly increased since 08/07/2009.
IMPRESSION: Mild perinephric inflammation of uncertain chronicity.

Tiny right pleural effusion and bibasilar atelectasis.

Thoracic and lumbar compression fractures again noted.  The L3
fracture may be slightly increased since 08/07/2009.

Cardiomegaly and coronary artery disease.

## 2011-05-15 IMAGING — CR DG ABDOMEN ACUTE W/ 1V CHEST
1 series · 1 of 1 positions shown · non-contrast
Comparison: 09/20/2009

CLINICAL DATA: Fever and vomiting.

ACUTE ABDOMEN SERIES (ABDOMEN 2 VIEW & CHEST 1 VIEW)

[t abdomen supine]
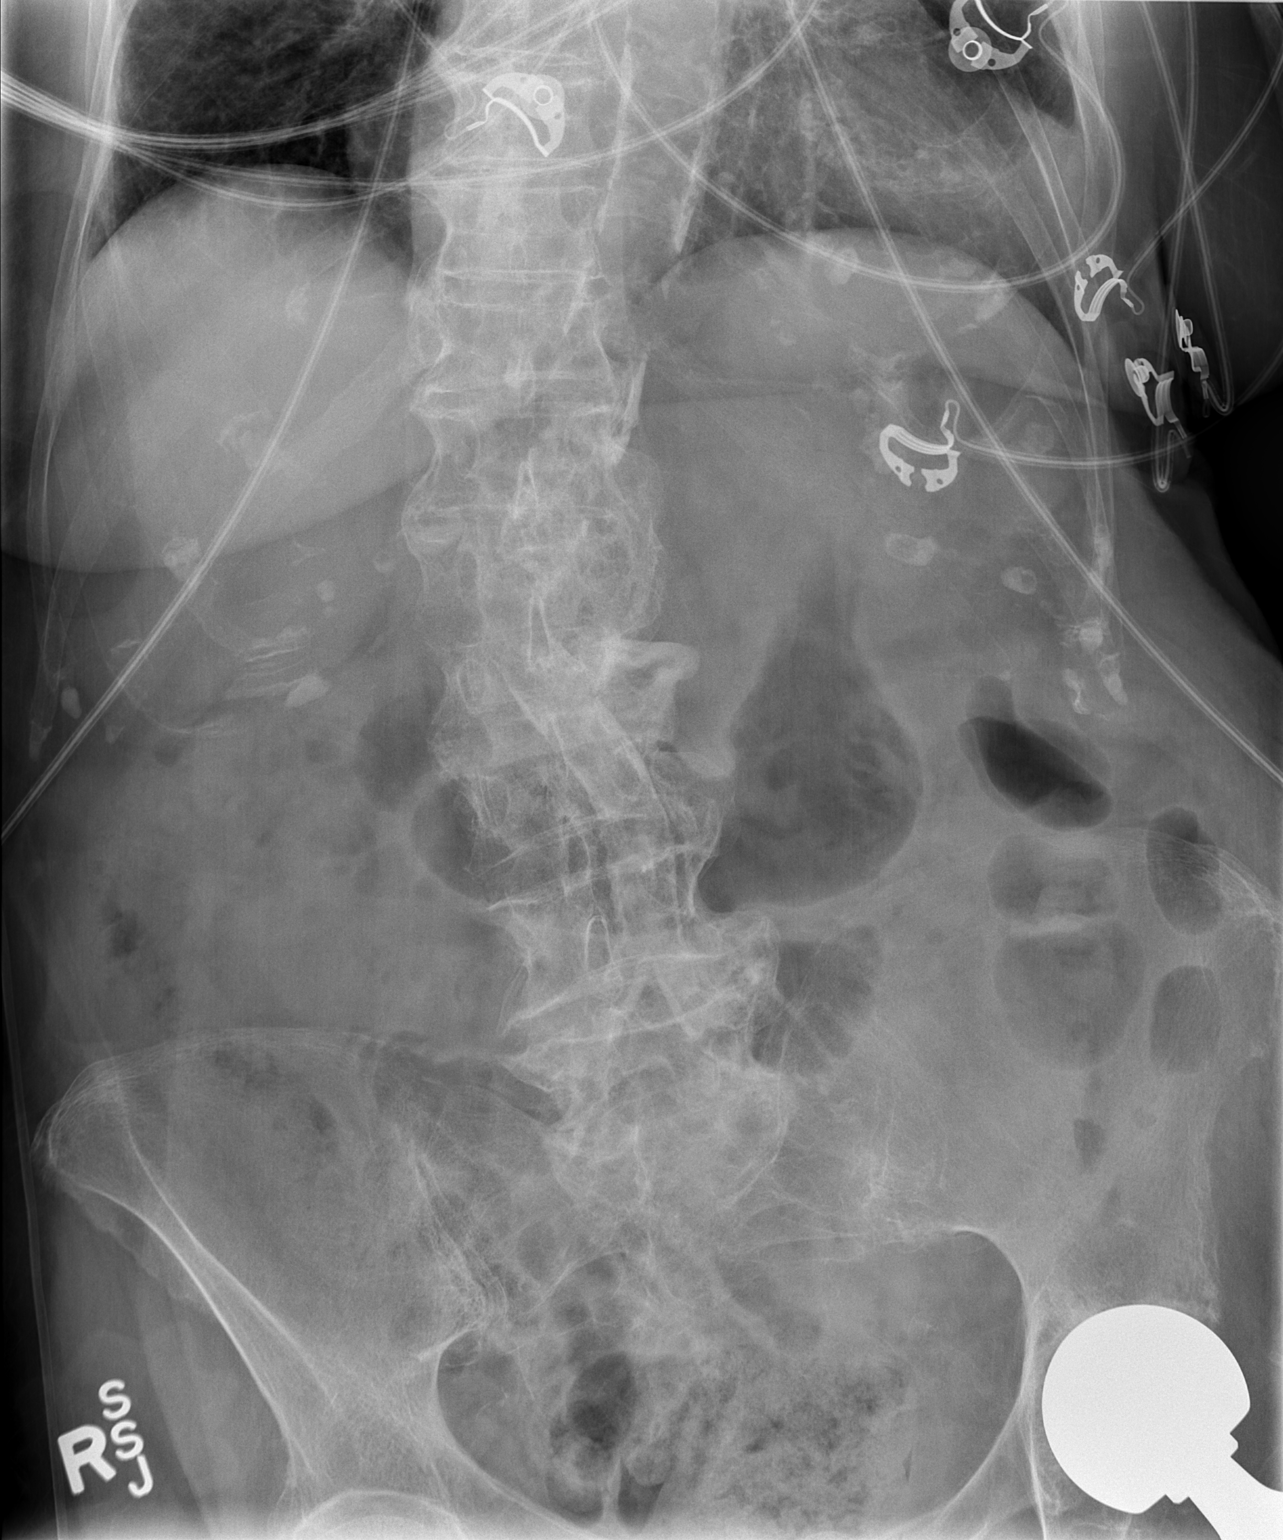

[1 of 1 positions shown; findings below may reference images not displayed]

FINDINGS: Frontal view of the chest demonstrates relatively mild
osteopenia.  Numerous leads and wires projecting over the chest.
Patient rotated to the left.  Mild cardiomegaly.  No right and no
definite left pleural effusion. No pneumothorax.  No congestive
failure.

Abominal films demonstrate no free intraperitoneal air.  Degraded
upright view secondary to patient positioning.  No significant
bowel distention.  Moderate rectal stool.  No definite abnormal
abdominal calcifications.  Densely calcified aorta.  Mild convex
right lumbar spine curvature.  Prior left hip arthroplasty.
IMPRESSION: 1.  Degraded upright view.
2.  No bowel obstruction.  Question constipation or mild fecal
impaction.
3.  Cardiomegaly without acute cardiopulmonary disease.

## 2011-05-29 LAB — POCT I-STAT, CHEM 8
BUN: 37 — ABNORMAL HIGH
Calcium, Ion: 1.14
Chloride: 105
Creatinine, Ser: 1.3 — ABNORMAL HIGH
Glucose, Bld: 148 — ABNORMAL HIGH
Potassium: 3.8

## 2011-05-29 LAB — URINALYSIS, ROUTINE W REFLEX MICROSCOPIC
Bilirubin Urine: NEGATIVE
Glucose, UA: NEGATIVE
Ketones, ur: 15 — AB
Nitrite: NEGATIVE
Specific Gravity, Urine: 1.012
pH: 6

## 2011-05-29 LAB — URINE MICROSCOPIC-ADD ON

## 2013-09-27 DEATH — deceased

## 2013-11-02 ENCOUNTER — Telehealth: Payer: Self-pay

## 2013-11-02 NOTE — Telephone Encounter (Signed)
[facility] [place] [employer]
